# Patient Record
Sex: Male | Born: 1935 | Race: White | Hispanic: No | Marital: Married | Smoking: Never smoker
Health system: Southern US, Community
[De-identification: ages and names within clinical notes are randomized; demographics above are authoritative.]

## PROBLEM LIST (undated history)

## (undated) DIAGNOSIS — G259 Extrapyramidal and movement disorder, unspecified: Secondary | ICD-10-CM

## (undated) DIAGNOSIS — G20A1 Parkinson's disease without dyskinesia, without mention of fluctuations: Secondary | ICD-10-CM

## (undated) DIAGNOSIS — E119 Type 2 diabetes mellitus without complications: Secondary | ICD-10-CM

## (undated) DIAGNOSIS — D689 Coagulation defect, unspecified: Secondary | ICD-10-CM

## (undated) DIAGNOSIS — Z87442 Personal history of urinary calculi: Secondary | ICD-10-CM

## (undated) DIAGNOSIS — M75102 Unspecified rotator cuff tear or rupture of left shoulder, not specified as traumatic: Secondary | ICD-10-CM

## (undated) DIAGNOSIS — H539 Unspecified visual disturbance: Secondary | ICD-10-CM

## (undated) DIAGNOSIS — I10 Essential (primary) hypertension: Secondary | ICD-10-CM

## (undated) DIAGNOSIS — M1990 Unspecified osteoarthritis, unspecified site: Secondary | ICD-10-CM

## (undated) DIAGNOSIS — M12812 Other specific arthropathies, not elsewhere classified, left shoulder: Secondary | ICD-10-CM

## (undated) DIAGNOSIS — F419 Anxiety disorder, unspecified: Secondary | ICD-10-CM

## (undated) DIAGNOSIS — G20 Parkinson's disease: Secondary | ICD-10-CM

## (undated) HISTORY — PX: ROTATOR CUFF REPAIR: SHX139

## (undated) HISTORY — DX: Unspecified visual disturbance: H53.9

## (undated) HISTORY — DX: Anxiety disorder, unspecified: F41.9

## (undated) HISTORY — PX: TOTAL KNEE ARTHROPLASTY: SHX125

## (undated) HISTORY — DX: Extrapyramidal and movement disorder, unspecified: G25.9

## (undated) HISTORY — PX: CATARACT EXTRACTION W/ INTRAOCULAR LENS  IMPLANT, BILATERAL: SHX1307

## (undated) HISTORY — DX: Essential (primary) hypertension: I10

---

## 2002-12-19 ENCOUNTER — Encounter: Admission: RE | Admit: 2002-12-19 | Discharge: 2003-03-19 | Payer: Self-pay | Admitting: Family Medicine

## 2005-09-24 ENCOUNTER — Inpatient Hospital Stay (HOSPITAL_COMMUNITY): Admission: EM | Admit: 2005-09-24 | Discharge: 2005-09-30 | Payer: Self-pay | Admitting: Emergency Medicine

## 2005-09-25 ENCOUNTER — Ambulatory Visit: Payer: Self-pay | Admitting: Internal Medicine

## 2005-09-26 ENCOUNTER — Ambulatory Visit: Payer: Self-pay | Admitting: Internal Medicine

## 2006-07-27 ENCOUNTER — Ambulatory Visit: Payer: Self-pay | Admitting: Internal Medicine

## 2006-07-27 ENCOUNTER — Ambulatory Visit (HOSPITAL_COMMUNITY): Admission: RE | Admit: 2006-07-27 | Discharge: 2006-07-27 | Payer: Self-pay | Admitting: Family Medicine

## 2006-07-27 ENCOUNTER — Encounter: Payer: Self-pay | Admitting: Vascular Surgery

## 2006-08-11 LAB — CBC WITH DIFFERENTIAL/PLATELET
BASO%: 0.4 % (ref 0.0–2.0)
Basophils Absolute: 0 10*3/uL (ref 0.0–0.1)
EOS%: 0.3 % (ref 0.0–7.0)
HCT: 42 % (ref 38.7–49.9)
HGB: 14.6 g/dL (ref 13.0–17.1)
MCH: 31.6 pg (ref 28.0–33.4)
MCHC: 34.7 g/dL (ref 32.0–35.9)
MCV: 91.1 fL (ref 81.6–98.0)
MONO%: 6.1 % (ref 0.0–13.0)
NEUT%: 66.1 % (ref 40.0–75.0)
RDW: 13.1 % (ref 11.2–14.6)
lymph#: 2.3 10*3/uL (ref 0.9–3.3)

## 2006-08-14 LAB — HYPERCOAGULABLE PANEL, COMPREHENSIVE
Beta-2 Glyco I IgG: <4 U/mL (ref <20)
Beta-2-Glycoprotein I IgM: <4 U/mL (ref <10)
Homocysteine: 8.7 umol/L (ref 4.0–15.4)
Protein C Activity: 187 % — ABNORMAL HIGH (ref 91–147)
Protein C, Total: 119 % (ref 63–153)
Protein S Activity: 120 % (ref 81–180)
Protein S Ag, Total: 135 % — ABNORMAL HIGH (ref 84–134)

## 2006-08-14 LAB — PROTHROMBIN TIME: Prothrombin Time: 12.9 seconds (ref 11.6–15.2)

## 2008-08-12 ENCOUNTER — Emergency Department (HOSPITAL_BASED_OUTPATIENT_CLINIC_OR_DEPARTMENT_OTHER): Admission: EM | Admit: 2008-08-12 | Discharge: 2008-08-12 | Payer: Self-pay | Admitting: *Deleted

## 2010-07-11 ENCOUNTER — Inpatient Hospital Stay (HOSPITAL_COMMUNITY): Admission: RE | Admit: 2010-07-11 | Discharge: 2010-07-13 | Payer: Self-pay | Admitting: Orthopedic Surgery

## 2010-08-12 ENCOUNTER — Encounter
Admission: RE | Admit: 2010-08-12 | Discharge: 2010-09-16 | Payer: Self-pay | Source: Home / Self Care | Attending: Orthopedic Surgery | Admitting: Orthopedic Surgery

## 2010-09-19 ENCOUNTER — Inpatient Hospital Stay (HOSPITAL_COMMUNITY)
Admission: EM | Admit: 2010-09-19 | Discharge: 2010-09-25 | Payer: Self-pay | Source: Home / Self Care | Attending: Internal Medicine | Admitting: Internal Medicine

## 2010-09-20 ENCOUNTER — Encounter (INDEPENDENT_AMBULATORY_CARE_PROVIDER_SITE_OTHER): Payer: Self-pay | Admitting: Internal Medicine

## 2010-11-14 ENCOUNTER — Other Ambulatory Visit: Payer: Self-pay | Admitting: Orthopedic Surgery

## 2010-11-14 ENCOUNTER — Encounter (HOSPITAL_COMMUNITY): Payer: MEDICARE | Attending: Orthopedic Surgery

## 2010-11-14 DIAGNOSIS — Z01812 Encounter for preprocedural laboratory examination: Secondary | ICD-10-CM | POA: Insufficient documentation

## 2010-11-14 LAB — URINALYSIS, ROUTINE W REFLEX MICROSCOPIC
Nitrite: NEGATIVE
Protein, ur: NEGATIVE mg/dL
Specific Gravity, Urine: 1.03 (ref 1.005–1.030)
Urobilinogen, UA: 0.2 mg/dL (ref 0.0–1.0)

## 2010-11-14 LAB — COMPREHENSIVE METABOLIC PANEL
Albumin: 3 g/dL — ABNORMAL LOW (ref 3.5–5.2)
BUN: 15 mg/dL (ref 6–23)
Calcium: 10.2 mg/dL (ref 8.4–10.5)
Creatinine, Ser: 0.92 mg/dL (ref 0.4–1.5)
Glucose, Bld: 178 mg/dL — ABNORMAL HIGH (ref 70–99)
Potassium: 4.9 mEq/L (ref 3.5–5.1)
Total Protein: 7.4 g/dL (ref 6.0–8.3)

## 2010-11-14 LAB — SURGICAL PCR SCREEN
MRSA, PCR: NEGATIVE
Staphylococcus aureus: NEGATIVE

## 2010-11-14 LAB — DIFFERENTIAL
Basophils Absolute: 0 10*3/uL (ref 0.0–0.1)
Basophils Relative: 0 % (ref 0–1)
Basophils Relative: 0 % (ref 0–1)
Eosinophils Absolute: 0 10*3/uL (ref 0.0–0.7)
Eosinophils Absolute: 0 10*3/uL (ref 0.0–0.7)
Monocytes Absolute: 0.8 10*3/uL (ref 0.1–1.0)
Monocytes Relative: 7 % (ref 3–12)
Neutro Abs: 6.5 10*3/uL (ref 1.7–7.7)
Neutro Abs: 6.5 10*3/uL (ref 1.7–7.7)
Neutrophils Relative %: 58 % (ref 43–77)
Neutrophils Relative %: 58 % (ref 43–77)

## 2010-11-14 LAB — PROTIME-INR
INR: 2.93 — ABNORMAL HIGH (ref 0.00–1.49)
Prothrombin Time: 30.6 seconds — ABNORMAL HIGH (ref 11.6–15.2)

## 2010-11-14 LAB — CBC
Hemoglobin: 11.2 g/dL — ABNORMAL LOW (ref 13.0–17.0)
MCHC: 29.8 g/dL — ABNORMAL LOW (ref 30.0–36.0)
Platelets: 376 10*3/uL (ref 150–400)
RBC: 4.55 MIL/uL (ref 4.22–5.81)

## 2010-11-14 LAB — APTT: aPTT: 78 seconds — ABNORMAL HIGH (ref 24–37)

## 2010-11-15 LAB — NO BLOOD PRODUCTS

## 2010-11-21 ENCOUNTER — Inpatient Hospital Stay (HOSPITAL_COMMUNITY)
Admission: RE | Admit: 2010-11-21 | Discharge: 2010-11-24 | DRG: 470 | Disposition: A | Discharge status: Home Health | Payer: MEDICARE | Source: Ambulatory Visit | Attending: Orthopedic Surgery | Admitting: Orthopedic Surgery

## 2010-11-21 DIAGNOSIS — D62 Acute posthemorrhagic anemia: Secondary | ICD-10-CM | POA: Diagnosis not present

## 2010-11-21 DIAGNOSIS — Z96659 Presence of unspecified artificial knee joint: Secondary | ICD-10-CM

## 2010-11-21 DIAGNOSIS — Z7901 Long term (current) use of anticoagulants: Secondary | ICD-10-CM

## 2010-11-21 DIAGNOSIS — M1710 Unilateral primary osteoarthritis, unspecified knee: Principal | ICD-10-CM | POA: Diagnosis present

## 2010-11-21 DIAGNOSIS — F411 Generalized anxiety disorder: Secondary | ICD-10-CM | POA: Diagnosis present

## 2010-11-21 DIAGNOSIS — I82509 Chronic embolism and thrombosis of unspecified deep veins of unspecified lower extremity: Secondary | ICD-10-CM | POA: Diagnosis present

## 2010-11-21 DIAGNOSIS — I10 Essential (primary) hypertension: Secondary | ICD-10-CM | POA: Diagnosis present

## 2010-11-21 LAB — PROTIME-INR
INR: 1.09 (ref 0.00–1.49)
Prothrombin Time: 14.3 seconds (ref 11.6–15.2)

## 2010-11-21 LAB — GLUCOSE, CAPILLARY
Glucose-Capillary: 185 mg/dL — ABNORMAL HIGH (ref 70–99)
Glucose-Capillary: 180 mg/dL — ABNORMAL HIGH (ref 70–99)

## 2010-11-22 LAB — BASIC METABOLIC PANEL
BUN: 11 mg/dL (ref 6–23)
Chloride: 101 mEq/L (ref 96–112)
GFR calc non Af Amer: >60 mL/min (ref >60)
Glucose, Bld: 262 mg/dL — ABNORMAL HIGH (ref 70–99)
Potassium: 4.1 mEq/L (ref 3.5–5.1)
Sodium: 135 mEq/L (ref 135–145)

## 2010-11-22 LAB — PROTIME-INR: Prothrombin Time: 15 seconds (ref 11.6–15.2)

## 2010-11-22 LAB — GLUCOSE, CAPILLARY
Glucose-Capillary: 243 mg/dL — ABNORMAL HIGH (ref 70–99)
Glucose-Capillary: 165 mg/dL — ABNORMAL HIGH (ref 70–99)

## 2010-11-22 LAB — CBC
HCT: 27.3 % — ABNORMAL LOW (ref 39.0–52.0)
Hemoglobin: 8.3 g/dL — ABNORMAL LOW (ref 13.0–17.0)
MCH: 24.9 pg — ABNORMAL LOW (ref 26.0–34.0)
MCHC: 30.4 g/dL (ref 30.0–36.0)
RBC: 3.34 MIL/uL — ABNORMAL LOW (ref 4.22–5.81)

## 2010-11-22 LAB — HEMOGLOBIN A1C
Hgb A1c MFr Bld: 8.7 % — ABNORMAL HIGH (ref <5.7)
Mean Plasma Glucose: 203 mg/dL — ABNORMAL HIGH (ref <117)

## 2010-11-23 LAB — CBC
MCH: 24.5 pg — ABNORMAL LOW (ref 26.0–34.0)
MCHC: 30.1 g/dL (ref 30.0–36.0)
MCV: 81.3 fL (ref 78.0–100.0)
Platelets: 273 10*3/uL (ref 150–400)
RDW: 17.2 % — ABNORMAL HIGH (ref 11.5–15.5)

## 2010-11-23 LAB — GLUCOSE, CAPILLARY: Glucose-Capillary: 154 mg/dL — ABNORMAL HIGH (ref 70–99)

## 2010-11-23 LAB — BASIC METABOLIC PANEL
CO2: 27 mEq/L (ref 19–32)
Calcium: 9 mg/dL (ref 8.4–10.5)
Creatinine, Ser: 0.76 mg/dL (ref 0.4–1.5)
Glucose, Bld: 160 mg/dL — ABNORMAL HIGH (ref 70–99)

## 2010-11-24 LAB — CBC
HCT: 24.7 % — ABNORMAL LOW (ref 39.0–52.0)
Hemoglobin: 7.6 g/dL — ABNORMAL LOW (ref 13.0–17.0)
RDW: 17.3 % — ABNORMAL HIGH (ref 11.5–15.5)
WBC: 10.8 10*3/uL — ABNORMAL HIGH (ref 4.0–10.5)

## 2010-11-24 LAB — GLUCOSE, CAPILLARY: Glucose-Capillary: 172 mg/dL — ABNORMAL HIGH (ref 70–99)

## 2010-11-24 LAB — PROTIME-INR: INR: 1.39 (ref 0.00–1.49)

## 2010-12-06 NOTE — Op Note (Signed)
NAME:  [name] [name], [name] [name]                 ACCOUNT NO.:  [id number]  MEDICAL RECORD NO.:  [id number]           PATIENT TYPE:  I  LOCATION:  1621                         FACILITY:  [facility]  PHYSICIAN:  Lovell Nuttall L. Rendall, M.D.  DATE OF BIRTH:  09/25/1934  DATE OF PROCEDURE:  11/21/2010 DATE OF DISCHARGE:                              OPERATIVE REPORT   PREOPERATIVE DIAGNOSIS:  Osteoarthritis, left knee.  SURGICAL PROCEDURES:  Left LCS total knee arthroplasty with computer navigation assistance.  POSTOPERATIVE DIAGNOSIS:  Osteoarthritis, left knee.  SURGEON:  Chakira Jachim L. Rendall, MD  ASSISTANT:  Legrand Pitts. Duffy, PAC - present and participated in entire procedure.  ANESTHESIA:  General with femoral nerve block.  FINDINGS:  Worn out left knee with bone against bone laterally and chronic inflammatory arthritis.  Justification for inpatient setting, pain control, and range of motion.  PROCEDURE IN DETAIL:  Under general anesthesia with femoral nerve block, the left leg was prepared with DuraPrep and draped as a sterile field. Sterile tourniquet was elevated at 350 mm after wrapping out the leg with an Esmarch.  Midline incision was made.  The patella was everted, femur was sized to a standard.  Debridement was done in preparation for computer mapping.  Once debridement was completed, attention was turned to inserting Schanz pins in the superior medial tibia and distal femur. The arrays were set up, mapping was then completed.  Using the first guide, proximal tibial resection was carried out within 1 degree of anatomic alignment.  Balancer was then inserted and the collateral ligaments were balanced within 1 degree of anatomic alignment.  Flexion gap was then measured.  Attention was then turned to the femur and using the computer guide, anterior and posterior flare of the distal femur were resected for a standard prosthesis.  The distal femoral cut was then made.  Flexion and extension gaps were  balanced at approximately 12- 1/2. Debridement of the menisci and cruciates was then done and recessing guide was then used.  Proximal tibia was then exposed.  It was sized for line to line and center peg hole with keel were inserted.  The trial #4 was inserted along with the 12.5 bearing and a standard femur.  There was excellent fit and alignment, but slight back knee or hyperextension which is corrected by going to a 15-mm bearing.  The patella was then osteotomized and 3 peg holes inserted.  With all these components, the knee has within 1 degree of anatomic alignment extension to neutral and very stable to varus valgus and drawer testing.  Permanent components were then obtained.  Bony surfaces were prepared with pulse irrigation. The permanent components were then cemented in place.  Tourniquet was let down at 1 hour 5 minutes.  After the cement was hardened, multiple small vessels were cauterized.  Knee was then closed in layers with #1 Tycron, #1 Vicryl, 2-0 Vicryl, and skin clips.  Medium Hemovac drain was utilized.  The patient tolerated the procedure well and returned to Recovery in good condition.     Shardai Star L. Rendall, M.D.     [name] [name]  D:  11/21/2010  T:  11/21/2010  Job:  [id number]  Electronically Signed by Erasmo Leventhal M.D. on 12/06/2010 01:17:08 PM

## 2010-12-09 LAB — GLUCOSE, CAPILLARY
Glucose-Capillary: 131 mg/dL — ABNORMAL HIGH (ref 70–99)
Glucose-Capillary: 137 mg/dL — ABNORMAL HIGH (ref 70–99)
Glucose-Capillary: 143 mg/dL — ABNORMAL HIGH (ref 70–99)
Glucose-Capillary: 153 mg/dL — ABNORMAL HIGH (ref 70–99)
Glucose-Capillary: 189 mg/dL — ABNORMAL HIGH (ref 70–99)
Glucose-Capillary: 189 mg/dL — ABNORMAL HIGH (ref 70–99)
Glucose-Capillary: 195 mg/dL — ABNORMAL HIGH (ref 70–99)
Glucose-Capillary: 218 mg/dL — ABNORMAL HIGH (ref 70–99)
Glucose-Capillary: 220 mg/dL — ABNORMAL HIGH (ref 70–99)
Glucose-Capillary: 221 mg/dL — ABNORMAL HIGH (ref 70–99)
Glucose-Capillary: 262 mg/dL — ABNORMAL HIGH (ref 70–99)

## 2010-12-09 LAB — POCT I-STAT, CHEM 8
Calcium, Ion: 1.21 mmol/L (ref 1.12–1.32)
Glucose, Bld: 148 mg/dL — ABNORMAL HIGH (ref 70–99)
HCT: 33 % — ABNORMAL LOW (ref 39.0–52.0)
Hemoglobin: 11.2 g/dL — ABNORMAL LOW (ref 13.0–17.0)
TCO2: 28 mmol/L (ref 0–100)

## 2010-12-09 LAB — URINALYSIS, ROUTINE W REFLEX MICROSCOPIC
Glucose, UA: NEGATIVE mg/dL
Hgb urine dipstick: NEGATIVE
Specific Gravity, Urine: 1.01 (ref 1.005–1.030)

## 2010-12-09 LAB — COMPREHENSIVE METABOLIC PANEL
Albumin: 2.9 g/dL — ABNORMAL LOW (ref 3.5–5.2)
Alkaline Phosphatase: 68 U/L (ref 39–117)
BUN: 17 mg/dL (ref 6–23)
CO2: 26 mEq/L (ref 19–32)
Calcium: 9.3 mg/dL (ref 8.4–10.5)
Creatinine, Ser: 1.08 mg/dL (ref 0.4–1.5)
GFR calc Af Amer: >60 mL/min (ref >60)
GFR calc non Af Amer: >60 mL/min (ref >60)
Glucose, Bld: 202 mg/dL — ABNORMAL HIGH (ref 70–99)
Potassium: 4 mEq/L (ref 3.5–5.1)
Total Protein: 6.7 g/dL (ref 6.0–8.3)
Total Protein: 7.3 g/dL (ref 6.0–8.3)

## 2010-12-09 LAB — CBC
HCT: 29.9 % — ABNORMAL LOW (ref 39.0–52.0)
Hemoglobin: 9.2 g/dL — ABNORMAL LOW (ref 13.0–17.0)
MCH: 25 pg — ABNORMAL LOW (ref 26.0–34.0)
MCH: 25.3 pg — ABNORMAL LOW (ref 26.0–34.0)
MCH: 25.8 pg — ABNORMAL LOW (ref 26.0–34.0)
MCHC: 30.8 g/dL (ref 30.0–36.0)
MCHC: 30.8 g/dL (ref 30.0–36.0)
MCV: 81.1 fL (ref 78.0–100.0)
MCV: 81.5 fL (ref 78.0–100.0)
Platelets: 407 10*3/uL — ABNORMAL HIGH (ref 150–400)
Platelets: 427 10*3/uL — ABNORMAL HIGH (ref 150–400)
Platelets: 434 10*3/uL — ABNORMAL HIGH (ref 150–400)
RBC: 3.91 MIL/uL — ABNORMAL LOW (ref 4.22–5.81)
RDW: 14.7 % (ref 11.5–15.5)
RDW: 14.8 % (ref 11.5–15.5)
RDW: 15 % (ref 11.5–15.5)
RDW: 15.1 % (ref 11.5–15.5)
WBC: 9 10*3/uL (ref 4.0–10.5)
WBC: 9.5 10*3/uL (ref 4.0–10.5)

## 2010-12-09 LAB — LIPID PANEL
Cholesterol: 185 mg/dL (ref 0–200)
HDL: 52 mg/dL (ref >39)
LDL Cholesterol: 113 mg/dL — ABNORMAL HIGH (ref 0–99)
Triglycerides: 99 mg/dL (ref <150)

## 2010-12-09 LAB — RAPID URINE DRUG SCREEN, HOSP PERFORMED
Amphetamines: NOT DETECTED
Barbiturates: NOT DETECTED
Benzodiazepines: POSITIVE — AB
Opiates: NOT DETECTED

## 2010-12-09 LAB — PROTIME-INR
INR: 1.93 — ABNORMAL HIGH (ref 0.00–1.49)
INR: 2 — ABNORMAL HIGH (ref 0.00–1.49)
INR: 2.26 — ABNORMAL HIGH (ref 0.00–1.49)
INR: 2.57 — ABNORMAL HIGH (ref 0.00–1.49)
INR: 2.7 — ABNORMAL HIGH (ref 0.00–1.49)
Prothrombin Time: 27.7 seconds — ABNORMAL HIGH (ref 11.6–15.2)
Prothrombin Time: 28.8 seconds — ABNORMAL HIGH (ref 11.6–15.2)

## 2010-12-09 LAB — POCT CARDIAC MARKERS: Troponin i, poc: <0.05 ng/mL (ref 0.00–0.09)

## 2010-12-09 LAB — TSH: TSH: 0.628 u[IU]/mL (ref 0.350–4.500)

## 2010-12-09 LAB — IRON AND TIBC
Iron: 13 ug/dL — ABNORMAL LOW (ref 42–135)
Saturation Ratios: 6 % — ABNORMAL LOW (ref 20–55)
UIBC: 203 ug/dL

## 2010-12-09 LAB — CARDIAC PANEL(CRET KIN+CKTOT+MB+TROPI)
CK, MB: 1 ng/mL (ref 0.3–4.0)
Relative Index: INVALID (ref 0.0–2.5)
Total CK: 24 U/L (ref 7–232)
Total CK: 27 U/L (ref 7–232)
Troponin I: 0.02 ng/mL (ref 0.00–0.06)

## 2010-12-09 LAB — HEMOGLOBIN A1C: Hgb A1c MFr Bld: 7.3 % — ABNORMAL HIGH (ref <5.7)

## 2010-12-09 LAB — T3: T3, Total: 90.2 ng/dl (ref 80.0–204.0)

## 2010-12-09 LAB — DIFFERENTIAL
Basophils Absolute: 0 10*3/uL (ref 0.0–0.1)
Basophils Relative: 0 % (ref 0–1)
Eosinophils Absolute: 0 10*3/uL (ref 0.0–0.7)
Lymphs Abs: 3.3 10*3/uL (ref 0.7–4.0)
Neutrophils Relative %: 55 % (ref 43–77)

## 2010-12-09 LAB — CK TOTAL AND CKMB (NOT AT ARMC)
CK, MB: 0.9 ng/mL (ref 0.3–4.0)
Total CK: 21 U/L (ref 7–232)

## 2010-12-09 LAB — POTASSIUM: Potassium: 4 mEq/L (ref 3.5–5.1)

## 2010-12-09 LAB — MAGNESIUM: Magnesium: 1.9 mg/dL (ref 1.5–2.5)

## 2010-12-11 LAB — CBC
HCT: 27.6 % — ABNORMAL LOW (ref 39.0–52.0)
MCH: 30.2 pg (ref 26.0–34.0)
MCV: 89.1 fL (ref 78.0–100.0)
Platelets: 283 10*3/uL (ref 150–400)
RBC: 3.1 MIL/uL — ABNORMAL LOW (ref 4.22–5.81)

## 2010-12-11 LAB — BASIC METABOLIC PANEL
BUN: 14 mg/dL (ref 6–23)
CO2: 27 mEq/L (ref 19–32)
Chloride: 101 mEq/L (ref 96–112)
Creatinine, Ser: 0.95 mg/dL (ref 0.4–1.5)
GFR calc Af Amer: >60 mL/min (ref >60)

## 2010-12-11 LAB — GLUCOSE, CAPILLARY: Glucose-Capillary: 175 mg/dL — ABNORMAL HIGH (ref 70–99)

## 2010-12-12 LAB — SYNOVIAL CELL COUNT + DIFF, W/ CRYSTALS
Lymphocytes-Synovial Fld: 8 % (ref 0–20)
Neutrophil, Synovial: 89 % — ABNORMAL HIGH (ref 0–25)
WBC, Synovial: 35225 /mm3 — ABNORMAL HIGH (ref 0–200)

## 2010-12-12 LAB — PROTIME-INR
INR: 1.02 (ref 0.00–1.49)
Prothrombin Time: 13.6 seconds (ref 11.6–15.2)

## 2010-12-12 LAB — ANAEROBIC CULTURE

## 2010-12-12 LAB — COMPREHENSIVE METABOLIC PANEL
ALT: 23 U/L (ref 0–53)
AST: 20 U/L (ref 0–37)
Albumin: 3.4 g/dL — ABNORMAL LOW (ref 3.5–5.2)
Alkaline Phosphatase: 63 U/L (ref 39–117)
BUN: 26 mg/dL — ABNORMAL HIGH (ref 6–23)
Chloride: 100 mEq/L (ref 96–112)
GFR calc Af Amer: >60 mL/min (ref >60)
Potassium: 4.7 mEq/L (ref 3.5–5.1)
Sodium: 138 mEq/L (ref 135–145)
Total Bilirubin: 0.8 mg/dL (ref 0.3–1.2)
Total Protein: 7.6 g/dL (ref 6.0–8.3)

## 2010-12-12 LAB — CBC
HCT: 31.1 % — ABNORMAL LOW (ref 39.0–52.0)
MCH: 30.3 pg (ref 26.0–34.0)
MCV: 88.8 fL (ref 78.0–100.0)
MCV: 89.8 fL (ref 78.0–100.0)
Platelets: 315 10*3/uL (ref 150–400)
Platelets: 388 10*3/uL (ref 150–400)
RBC: 4.57 MIL/uL (ref 4.22–5.81)
RDW: 12.2 % (ref 11.5–15.5)
WBC: 12.8 10*3/uL — ABNORMAL HIGH (ref 4.0–10.5)

## 2010-12-12 LAB — URINALYSIS, ROUTINE W REFLEX MICROSCOPIC
Bilirubin Urine: NEGATIVE
Glucose, UA: NEGATIVE mg/dL
Ketones, ur: NEGATIVE mg/dL
Nitrite: NEGATIVE
Specific Gravity, Urine: 1.02 (ref 1.005–1.030)
pH: 5.5 (ref 5.0–8.0)

## 2010-12-12 LAB — DIFFERENTIAL
Basophils Absolute: 0 10*3/uL (ref 0.0–0.1)
Basophils Relative: 0 % (ref 0–1)
Eosinophils Absolute: 0 10*3/uL (ref 0.0–0.7)
Eosinophils Relative: 0 % (ref 0–5)
Monocytes Absolute: 0.5 10*3/uL (ref 0.1–1.0)
Monocytes Relative: 4 % (ref 3–12)

## 2010-12-12 LAB — GLUCOSE, CAPILLARY
Glucose-Capillary: 164 mg/dL — ABNORMAL HIGH (ref 70–99)
Glucose-Capillary: 171 mg/dL — ABNORMAL HIGH (ref 70–99)
Glucose-Capillary: 235 mg/dL — ABNORMAL HIGH (ref 70–99)

## 2010-12-12 LAB — GRAM STAIN

## 2010-12-12 LAB — BODY FLUID CULTURE: Culture: NO GROWTH

## 2010-12-12 LAB — BASIC METABOLIC PANEL
Calcium: 9.1 mg/dL (ref 8.4–10.5)
GFR calc Af Amer: >60 mL/min (ref >60)
GFR calc non Af Amer: >60 mL/min (ref >60)
Potassium: 4.5 mEq/L (ref 3.5–5.1)
Sodium: 136 mEq/L (ref 135–145)

## 2010-12-12 LAB — SURGICAL PCR SCREEN: MRSA, PCR: NEGATIVE

## 2010-12-25 ENCOUNTER — Ambulatory Visit: Payer: MEDICARE | Attending: Orthopedic Surgery | Admitting: Physical Therapy

## 2010-12-25 DIAGNOSIS — IMO0001 Reserved for inherently not codable concepts without codable children: Secondary | ICD-10-CM | POA: Insufficient documentation

## 2010-12-25 DIAGNOSIS — Z96659 Presence of unspecified artificial knee joint: Secondary | ICD-10-CM | POA: Insufficient documentation

## 2010-12-25 DIAGNOSIS — R262 Difficulty in walking, not elsewhere classified: Secondary | ICD-10-CM | POA: Insufficient documentation

## 2010-12-25 DIAGNOSIS — M25669 Stiffness of unspecified knee, not elsewhere classified: Secondary | ICD-10-CM | POA: Insufficient documentation

## 2010-12-25 DIAGNOSIS — M25569 Pain in unspecified knee: Secondary | ICD-10-CM | POA: Insufficient documentation

## 2010-12-30 ENCOUNTER — Ambulatory Visit: Payer: MEDICARE | Attending: Orthopedic Surgery | Admitting: Physical Therapy

## 2010-12-30 DIAGNOSIS — IMO0001 Reserved for inherently not codable concepts without codable children: Secondary | ICD-10-CM | POA: Insufficient documentation

## 2010-12-30 DIAGNOSIS — Z96659 Presence of unspecified artificial knee joint: Secondary | ICD-10-CM | POA: Insufficient documentation

## 2010-12-30 DIAGNOSIS — R262 Difficulty in walking, not elsewhere classified: Secondary | ICD-10-CM | POA: Insufficient documentation

## 2010-12-30 DIAGNOSIS — M25669 Stiffness of unspecified knee, not elsewhere classified: Secondary | ICD-10-CM | POA: Insufficient documentation

## 2010-12-30 DIAGNOSIS — M25569 Pain in unspecified knee: Secondary | ICD-10-CM | POA: Insufficient documentation

## 2010-12-31 NOTE — Discharge Summary (Signed)
NAME:  [name] [name], [name] [name]                 ACCOUNT NO.:  [id number]  MEDICAL RECORD NO.:  [id number]           PATIENT TYPE:  I  LOCATION:  1621                         FACILITY:  [facility]  PHYSICIAN:  Kortney Potvin L. Rendall, M.D.  DATE OF BIRTH:  1934-05-15  DATE OF ADMISSION:  11/21/2010 DATE OF DISCHARGE:  11/24/2010                              DISCHARGE SUMMARY   ADMISSION DIAGNOSES: 1. End-stage osteoarthritis, left knee, with a history of a right     total knee arthroplasty. 2. History of deep venous thrombosis and pulmonary embolism, on     chronic Coumadin. 3. Diabetes mellitus. 4. Anxiety. 5. Hypertension.  DISCHARGE DIAGNOSES: 1. End-stage osteoarthritis, left knee, status post left total knee     arthroplasty. 2. Acute blood loss anemia secondary to surgery. 3. Mild confusion at night possibly due to medications. 4. History of right total knee arthroplasty. 5. History of deep venous thrombosis and pulmonary embolism, on     chronic Coumadin. 6. Diabetes mellitus. 7. Anxiety. 8. Hypertension.  SURGICAL PROCEDURES:  On November 21, 2010, [name] [name] underwent a left total knee arthroplasty with computer navigation by Dr. Jonny Ruiz L. Rendall, assisted by Arnoldo Morale, PA-C.  He had a DePuy LCS complete metal back patella placed, cemented size standard with the primary femoral component cemented size standard left.  A tibial tray rotating platform NBT keel size 4 cemented with a LCS complete RP insert 15 mm thickness standard.  COMPLICATIONS:  None.  CONSULTS:  Physical Therapy and Occupational Therapy consult, November 23, 2010.  HISTORY OF PRESENT ILLNESS:  This 77 year old white male patient, presented to Dr. Priscille Kluver with history of a right total knee in October of last year and now with a 2-year history of gradual onset progressive left knee pain without injury or prior surgery.  At this point, the left knee pain is constant, stiff and hard sensation, diffuse about the knee with  some radiation into the foot.  It increases with weightbearing and moving from a sitting to a standing position and then decreases with Tylenol.  The knee pops, grinds, swells, and keeps him up at night.  He has failed conservative treatment and because of that, he is presenting for a left knee replacement.  HOSPITAL COURSE:  [name] [name] tolerated the surgical procedure well without immediate postoperative complications.  Postop day #1, T-max was 100.2, vitals were stable but hemoglobin was 8.3, hematocrit 27.3.  his Hemovac was discontinued.  He was weaned off his oxygen and vitals were monitored and he was continued on his current pain medicine.  Postop day #2, he had had a little bit of confusion at night.  He tried to get out of bed and slipped.  He had no injury at that time.  His T- max was 99.4.  Hemoglobin/hematocrit were stable.  Incision was well approximated with staples and he was doing well with therapy.  He was switched to Ultracet for pain, taken off the PCA.  He had some constipation that was treated with a laxative and he was continued on therapy.  He continued to do well.  Mental status did improve.  He seems to have some confusion at night but it was felt on February 26 study, was ready for discharge home and he was discharged home later that day.  DISCHARGE INSTRUCTIONS:  Diet:  He can resume his regular prehospitalization diet.  Medications:  Please see the home med discharge instruction sheet for complete documentation of medications.  We did place him on some new medicines including Celebrex once a day and Lovenox until his Coumadin therapeutic and Ultram for pain.  Again, please see the med discharge instruction sheet for complete documentation of the medications.  Activity:  He can be out of bed, weightbearing as tolerated on the left leg with use of a walker.  No lifting or driving for 6 weeks.  Home CPM 0 to 90 degrees 6-8 hours a day.  Please see the white  total joint discharge sheet for further activity instructions.  Wound Care:  Please see the white total joint discharge sheet for wound care instructions.  Followup:  He is to follow up with Dr. Priscille Kluver in our office on Thursday, March 8, and needs to call [phone] for that appointment.  He is arranged for his Coumadin.  PT and INR to be done at his cardiologist office as it was preoperatively.  LABORATORY DATA:  Hemoglobin/hematocrit ranged from 8.3 and 27.3 on the 24th to 7.6 and 24.7 on the 26th.  White count went from 11.7 on the 24th to 10.8 on the 26th.  PT and INR went from 14.3 and 1.09 on the 23rd to 17.3 and 1.39 on the 26th.  Sodium ranged from 135 on the 24th to 134 on the 25th.  Glucose went from 262 on the 24th to 160 on the 25th.  Hemoglobin A1c was 8.7% on the 24th.  All other laboratory studies were within normal limits.  Please let it be noted that he was not transfused with a low hemoglobin/hematocrit because he is a Jehovah's Witness and refused transfusion.     Legrand Pitts Duffy, P.A.   ______________________________ Carlisle Beers. Priscille Kluver, M.D.    KED/MEDQ  D:  12/18/2010  T:  12/18/2010  Job:  [id number]  Electronically Signed by Otilio Jefferson. on 12/25/2010 01:05:13 PM Electronically Signed by Erasmo Leventhal M.D. on 12/31/2010 01:43:09 PM

## 2011-01-01 ENCOUNTER — Ambulatory Visit: Payer: MEDICARE | Admitting: Physical Therapy

## 2011-01-06 ENCOUNTER — Ambulatory Visit: Payer: MEDICARE | Admitting: Rehabilitation

## 2011-01-07 ENCOUNTER — Other Ambulatory Visit (HOSPITAL_BASED_OUTPATIENT_CLINIC_OR_DEPARTMENT_OTHER): Payer: Self-pay | Admitting: Orthopedic Surgery

## 2011-01-07 DIAGNOSIS — M25519 Pain in unspecified shoulder: Secondary | ICD-10-CM

## 2011-01-08 ENCOUNTER — Ambulatory Visit: Payer: MEDICARE | Admitting: Rehabilitation

## 2011-01-08 ENCOUNTER — Ambulatory Visit (HOSPITAL_BASED_OUTPATIENT_CLINIC_OR_DEPARTMENT_OTHER)
Admission: RE | Admit: 2011-01-08 | Discharge: 2011-01-08 | Disposition: A | Discharge status: Home / Self Care | Payer: MEDICARE | Source: Ambulatory Visit | Attending: Orthopedic Surgery | Admitting: Orthopedic Surgery

## 2011-01-08 DIAGNOSIS — M25619 Stiffness of unspecified shoulder, not elsewhere classified: Secondary | ICD-10-CM | POA: Insufficient documentation

## 2011-01-08 DIAGNOSIS — M6281 Muscle weakness (generalized): Secondary | ICD-10-CM | POA: Insufficient documentation

## 2011-01-08 DIAGNOSIS — M67919 Unspecified disorder of synovium and tendon, unspecified shoulder: Secondary | ICD-10-CM | POA: Insufficient documentation

## 2011-01-08 DIAGNOSIS — M19019 Primary osteoarthritis, unspecified shoulder: Secondary | ICD-10-CM | POA: Insufficient documentation

## 2011-01-08 DIAGNOSIS — M25519 Pain in unspecified shoulder: Secondary | ICD-10-CM

## 2011-01-08 DIAGNOSIS — M719 Bursopathy, unspecified: Secondary | ICD-10-CM | POA: Insufficient documentation

## 2011-01-13 ENCOUNTER — Ambulatory Visit: Payer: MEDICARE | Admitting: Physical Therapy

## 2011-01-14 ENCOUNTER — Encounter: Payer: MEDICARE | Admitting: Physical Therapy

## 2011-01-17 ENCOUNTER — Ambulatory Visit: Payer: MEDICARE | Admitting: Rehabilitation

## 2011-01-20 ENCOUNTER — Ambulatory Visit: Payer: MEDICARE | Admitting: Physical Therapy

## 2011-01-22 ENCOUNTER — Ambulatory Visit: Payer: MEDICARE | Admitting: Physical Therapy

## 2011-01-28 ENCOUNTER — Ambulatory Visit: Payer: Medicare Other | Attending: Orthopedic Surgery | Admitting: Physical Therapy

## 2011-01-28 DIAGNOSIS — R262 Difficulty in walking, not elsewhere classified: Secondary | ICD-10-CM | POA: Insufficient documentation

## 2011-01-28 DIAGNOSIS — Z96659 Presence of unspecified artificial knee joint: Secondary | ICD-10-CM | POA: Insufficient documentation

## 2011-01-28 DIAGNOSIS — IMO0001 Reserved for inherently not codable concepts without codable children: Secondary | ICD-10-CM | POA: Insufficient documentation

## 2011-01-28 DIAGNOSIS — M25569 Pain in unspecified knee: Secondary | ICD-10-CM | POA: Insufficient documentation

## 2011-01-28 DIAGNOSIS — M25669 Stiffness of unspecified knee, not elsewhere classified: Secondary | ICD-10-CM | POA: Insufficient documentation

## 2011-01-30 ENCOUNTER — Ambulatory Visit: Payer: Medicare Other | Admitting: Physical Therapy

## 2011-02-03 ENCOUNTER — Ambulatory Visit: Payer: Medicare Other | Admitting: Physical Therapy

## 2011-02-05 ENCOUNTER — Ambulatory Visit: Payer: Medicare Other | Admitting: Physical Therapy

## 2011-02-06 ENCOUNTER — Ambulatory Visit: Payer: Medicare Other | Admitting: Physical Therapy

## 2011-02-10 ENCOUNTER — Ambulatory Visit: Payer: Medicare Other | Admitting: Physical Therapy

## 2011-02-12 ENCOUNTER — Ambulatory Visit: Payer: Medicare Other | Admitting: Physical Therapy

## 2011-02-13 ENCOUNTER — Ambulatory Visit: Payer: Medicare Other | Admitting: Physical Therapy

## 2011-02-14 NOTE — Consult Note (Signed)
NAME:  JAKHI, DISHMAN                 ACCOUNT NO.:  [id number]   MEDICAL RECORD NO.:  [id number]          PATIENT TYPE:  INP   LOCATION:  3001                         FACILITY:  [facility]   PHYSICIAN:  Lajuana Matte, MD  DATE OF BIRTH:  [birth year]   DATE OF CONSULTATION:  09/25/2005  DATE OF DISCHARGE:                                   CONSULTATION   REASON FOR CONSULT:  Pulmonary embolism-right lower extremity deep venous  thrombosis.   HPI:  Mr. Defina is a very pleasant 77 year old white male admitted on  September 23, 2005 with chest discomfort, was found to have bilateral  pulmonary emboli with right lower extremity DVT.  The patient denies having  any travel history recently, except [place]  He had intentional 25 pound weight loss over the last 6  months, but otherwise no significant complaints.  He lives a healthy  lifestyle.  CT angio of the chest revealed a questionable 3.1 cm mass in the  left lobe of the thyroid gland.  This was worked up with an ultrasound and  fine-needle aspiration biopsy of the mass, although the pathology is still  pending.  The patient is currently on heparin and Coumadin.  Hypercoagulable  studies, including lupus anticoagulant, is negative.  Homocysteine is still  within normal range.  PSA and CA 19-9 are normal.  We were asked to see the  patient with recommendations regarding his condition.   PAST MEDICAL HISTORY:  1.  Hypertension.  2.  History of nephrolithiasis in 2003.   SURGERIES:  Status post cosmetic rhinoplasty.   PROCEDURES:  Status post fine-needle aspiration biopsy September 25, 2005  with interventional radiology.   ALLERGIES:  NKDA.   CURRENT MEDICATIONS:  Heparin per pharmacy.  Coumadin started on September 24, 2005, per pharmacy. HCTZ 25 mg q.d., Prinivil 20 mg q.d., Tylenol  p.r.n., Phenergan p.r.n.   CURRENT REVIEW OF SYSTEMS:  Essentially negative.  He used to take aspirin  until about 3 weeks ago, and then discontinued its use.   FAMILY HISTORY:  Mother died with CVA.  Father died of old age at 2.  One  brother alive, in good health.  No family history of coagulopathy.   SOCIAL HISTORY:  The patient is married, his wife Cordelia Pen is at his bedside.  He has 1 son in good health.  He is retired Microbiologist.  No  alcohol or tobacco history.  He never smoked.  He lives and active  lifestyle, walks about 30 minutes a day.  He has never been hospitalized.   PHYSICAL EXAMINATION:  GENERAL:  On physical exam, this is a well-developed,  well-nourished 77 year old white male in no acute distress, alert and  oriented x3.  VITAL SIGNS:  Blood pressure 105/57, pulse rate 77, respirations 20,  temperature 97.9, pulse oximetry 94% in room air.  Weight 186 pounds, height  69 inches.  HEENT:  Head is normocephalic, atraumatic.  PERRLA.  Oral mucosa without  thrush or lesions.  NECK:  Supple.  No cervical or supraclavicular masses.  Cannot palpate a  thyroid nodule.  LUNGS:  Clear to auscultation bilaterally.  AXILLA:  No axillary lymph nodes.  CARDIOVASCULAR:  Regular rate and rhythm without murmurs, rubs, or gallops.  ABDOMEN:  Soft, nontender, bowel sounds x4.  No palpable spleen or liver.  GU AND RECTAL:  Deferred.  EXTREMITIES:  With no clubbing, no cyanosis, no edema.  SKIN:  Without lesions.  No bruising.  No petechiae.  NEURO:  Nonfocal.   LABS:  Hemoglobin 13.3, hematocrit 38.1, white count 9.4, platelets 208,  neutrophils 9.7, MCV 88.8.  PT 13.1, PTT 24, INR 1.0.  TSH 1.063, T4 9.4.  Sodium 136, potassium 4.6, BUN 18, creatinine 1.1, glucose 139, total  bilirubin 0.9, alkaline phosphatase 60, AST 32, ALT 33, total protein 6.8,  albumin 3.7, calcium 9.5.  CA 19-9 10.8.  Hemoccult negative.  PSA 3.59.  Homocysteine 14.6.  PTT-LA 72.7, and after mixing 4:1, is 49.9.  Lupus  anticoagulant negative.  DRVVT 49.9, after mixing 1:1, is 38.9.  Pending  labs  are factor V Leiden, PT G20210A mutation, antiphospholipid S, beta 2  glycoprotein antibody, and protein S.   RADIOLOGY STUDIES:  On December 27, a thyroid ultrasound shows multiple  bilateral fine nodules suggesting multinodular goiter, largest 3.1 cm.  On  December 26, CT angio of the chest shows large bilateral pulmonary emboli,  and a 3.1 cm bleb in the right upper lobe, with mild atelectasis of the  right base.   ASSESSMENT AND PLAN:  Dr. Arbutus Ped has seen and evaluated the patient and the  chart has been reviewed.  This is a pleasant 77 year old white male, with no  significant medical history, never been hospitalized, who presents with  chest discomfort, and was found to have bilateral pulmonary emboli and right  lower extremity deep venous thrombosis.   RECOMMENDATIONS:  1.  Continue heparin and Coumadin until PT and INR are therapeutic between      the range of 2-3.  2.  Dr. Arbutus Ped does not strongly recommend IVC filter placement at this      point unless there is a recurrent DVT formation on therapeutic      anticoagulation.  3.  Check factor XII level.  4.  Dr. Arbutus Ped will repeat a hypercoagulable panel at the [facility] in a few weeks.  5.  If no hypercoagulable abnormalities are present, continue Coumadin for      at least 1 year, but if there is evidence of hypercoagulability      abnormalities, he may require anticoagulation for life.  Dr. Arbutus Ped      will arrange a followup appointment with him at the [facility] in 2 weeks.  The primary care physician will adjust his Coumadin      dose.   Thank you very much for allowing [place] the opportunity to participate in the  care of Mr. Mckey.      Marlowe Kays, P.A.      Lajuana Matte, MD  Electronically Signed   SW/MEDQ  D:  09/26/2005  T:  09/26/2005  Job:  [id number]

## 2011-02-14 NOTE — Discharge Summary (Signed)
NAME:  [name] [name], [name] [name]                 ACCOUNT NO.:  [id number]   MEDICAL RECORD NO.:  [id number]          PATIENT TYPE:  INP   LOCATION:  3001                         FACILITY:  [facility]   PHYSICIAN:  Lonia Blood, M.D.       DATE OF BIRTH:  [birth year]   DATE OF ADMISSION:  09/23/2005  DATE OF DISCHARGE:                                 DISCHARGE SUMMARY   DISCHARGE DIAGNOSIS:  1.  Deep venous thrombosis and pulmonary embolism.  2.  Hypertension.  3.  Anxiety.  4.  History of nephrolithiasis in 2003.   DISCHARGE MEDICATIONS:  1.  Coumadin 10 mg p.o. at 6 p.m.  2.  Lovenox 80 mg subcu q.12h.  3.  Lisinopril/HCTZ 20/25 mg 1 p.o. daily.  4.  Xanax XR 1 mg p.o. daily.   CONDITION ON DISCHARGE:  The patient was discharged in good condition.   DISCHARGE INSTRUCTIONS:  At the time of discharge, she was instructed to  follow up with Dr. Si Gaul at the [facility] and to follow up  with Dr. Leonette Most from [facility] on October 02, 2005, at 8:30  a.m. for a PT/INR check.  At the time of discharge, the patient was able to  ambulate without problems.  He was without any symptoms with oxygen  saturation stable hemodynamically.   PROCEDURES:  1.  CT scan of the chest on September 24, 2005, showing large bilateral      pulmonary emboli and 3.1 cm nodule in the thyroid gland.  2.  September 24, 2005, the patient had a fine needle aspiration guided      biopsy of the thyroid nodule and the pathology of the thyroid nodule was      benign thyroid tissue.   CONSULTATIONS:  Dr. Si Gaul, hematology   HISTORY AND PHYSICAL:  For history and physical, please refer to dictated  history and physical done by Dr. Corky Downs.   HOSPITAL COURSE:  Problem 1:  Pulmonary embolus.  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  For that reason, he was started on intravenous heparin upon  admission.  Complete workup for genetic causes of hypercoagulopathy was  undertaken that were  unrevealing.  Mr. Dusza was negative for factor V liden,  negative for prothrombin G mentation, negative for protein C and protein S  modifications.  He also had a PSA level that was within normal limits and a  CA-99 that was within normal limits.  Mr. Molina was advised to have a  colonoscopy done since he never had one.  His hemoccult check was negative.  Dr. Si Gaul from hematology saw the patient in consultation and  advised the patient to follow up with him in his clinic.  The patient was  transitioned from heparin to Lovenox on September 29, 2005.  At the time of  this dictation, the patient is learning how to self-administer the Lovenox  injections at home and he will follow up with his primary care physician,  Dr. Leonette Most, on October 02, 2005, for a PT/INR check and further instructions  on  the Lovenox and Coumadin.  At the time of discharge, Mr. Brege's INR was  1.4.   Problem 2:  Anxiety.  Throughout this hospitalization, Mr. Partch had  significant problems with anxiety.  After a long discussion with the patient  and the family, it was decided he should be treated with some Xanax Extend  Release at 1 mg daily.   Problem 3:  Hypertension.  Mr. Emig was kept on Lisinopril and  hydrochlorothiazide daily and his blood pressure was well controlled.   Problem 4:  Thyroid nodule.  This was an incidental finding on the CT scan  of the lungs.  The TSH and free T4 were checked and they were both within  normal limits. A biopsy of the thyroid nodule was performed and it was  consistent with benign tissue.      Lonia Blood, M.D.  Electronically Signed     SL/MEDQ  D:  09/30/2005  T:  09/30/2005  Job:  [id number]   cc:   Dr. Virgina Norfolk, MD  Fax: [phone]

## 2011-02-14 NOTE — H&P (Signed)
NAME:  [name] [name]                 ACCOUNT NO.:  [id number]   MEDICAL RECORD NO.:  [id number]          PATIENT TYPE:  INP   LOCATION:  1823                         FACILITY:  [facility]   PHYSICIAN:  Mobolaji B. Bakare, M.D.DATE OF BIRTH:  1933/10/08   DATE OF ADMISSION:  09/23/2005  DATE OF DISCHARGE:                                HISTORY & PHYSICAL   PRIMARY CARE PHYSICIAN:  Dr. Loyal Jacobson at [facility].   CHIEF COMPLAINT:  Chest pressure and dyspnea on exertion.   HISTORY OF PRESENTING COMPLAINT:  [name] [name] is a 77 year old Caucasian male  with a history of hypertension.  Over the last one week he has been feeling  short of breath on exertion with some minimal chest pressure.  These  symptoms have been intermittent in the last one week; however, yesterday  they became persistent and he felt something was wrong, and he needed to see  his primary care physician.  He also experienced palpitations.  He has no  chest pain, nausea or diaphoresis.   The patient was seen at urgent care, [facility],  yesterday afternoon.  He was noted to have a sinus tachycardia of 110, hence  he was referred to the emergency room.  He was evaluated with an EKG, which  showed sinus tachycardia with left axis deviation.  His point of care  cardiac enzymes were slightly elevated with troponin 0.11 and normal CK/MB.  D-dimer was elevated to 15.51.  These necessitated a CT angiogram of the  chest, which confirmed a large pulmonary embolism and he was started on a  heparin infusion.   The patient has no past medical history of blood clots.  He has no family  history of blood clots.  He is not a smoker.  He has had no recent surgery  or hospitalization.  He has a pretty active lifestyle.   REVIEW OF SYSTEMS:  The review of systems is positive for dizziness.  No  melanotic stools.  No headaches, abdominal pain, diarrhea, or vomiting.  No  fever, chills or rigors.   PAST MEDICAL  HISTORY:  1.  Kidney stones three years ago.  2.  Hypertension.   PAST SURGICAL HISTORY:  Nil.   MEDICATIONS:  Lisinopril hydrochlorothiazide 20/25 mg 1 p.o. daily.   ALLERGIES:  No known drug allergies.   FAMILY HISTORY:  Father died about two months ago from old age at the age of  27.  Mother passed away secondary to a stroke.  He has one brother who is  well.   SOCIAL HISTORY:  The patient has never smoked cigarettes.  He does not drink  alcohol.  He is independent in activities of daily living.  He walks almost  30 minutes everyday. He has a pretty active lifestyle.   PHYSICAL EXAMINATION:  VITAL SIGNS:  Initial vital signs; temperature 98.1,  blood pressure 148/86, pulse 126, respiratory rate 24 and O2 sat 96% on room  air.  GENERAL APPEARANCE:  On examination the patient is not dyspneic.  HEENT:  Normocephalic and atraumatic head.  Pupils are equal, round and  react to light.  Extraocular movements are intact.  Mucous membranes moist.  No oral thrush.  NECK:  No elevated JVD.  No carotid bruits.  No palpable thyromegaly.  LUNGS:  The lungs are clear clinically to auscultation.  HEART:  Cardiovascular exam reveals S1 and S2 normal.  No murmur, gallop or  rub.  ABDOMEN:  The abdomen is obese, soft and nontender.  There is no palpable  organomegaly.  RECTAL:  Prostate is not palpably enlarged.  Rectal mucosa feels smooth.  No  mass felt in the rectum.  Stool was sent for hemoccult.  EXTREMITIES:  No pedal edema.  There is left calf swelling, which is  nontender.  Dorsalis pedis pulses are palpable, 2+, bilaterally.  No  cyanosis.  NEUROLOGIC:  Central nervous system reveals no focal neurological deficit.  Cranial nerves II-XII are intact.  SKIN:  The skin reveals no rash.  No petechiae.   LABORATORY DATA:  Initial laboratory data; white blood cells 11.5,  hemoglobin 15.6, hematocrit 44.4, MCV 88, and platelets 218,000 with  neutrophils 69%, lymphocytes 25%, and absolute  neutrophil count 8.0.  Magnesium 2.0.  Sodium 136,  potassium 4.6, chloride 102, bicarb 25, glucose  139, BUN 18, and creatinine 1.1.  Bilirubin 0.9, alkaline phosphatase 60,  AST 52, ALT 53, total protein 6.8, albumin 3.7, and calcium 9.5.  PTT 24, PT  12.6 and INR 0.9.  Thyroxine 9.4, TSH 1.063.  Cardiac markers; CK/MB 3.2,  troponin 0.6 and myoglobin 107; second set CK/MB 2.8, troponin 0.11 and  myoglobin 113; third set CK/MB 3.8, troponin 1.14 and myoglobin 133.  Stool  hemoccult negative.  D-dimer 15.51.   CT angiogram of the chest reveals large bilateral pulmonary embolism, 3.1 cm  mass left lobe of thyroid.   ASSESSMENT AND PLAN:  1.  [name] [name] is a 77 year old Caucasian male presenting with dyspnea on      exertion, sinus tachycardia and elevated D-dimer; and, computerized      tomographic scan of the chest confirmed large bilateral pulmonary      emboli.  He has no identifiable risk factors.  I am concerned about      malignancy as a precipitating factor in his age group.   We will check his prostatic specific antigen and CA-19-9.  We will check the  factor V Leiden gene and the prothrombin 2 gene mutation, antiphospholipid  antibody and homocysteine level.  We will continue heparin infusion and  Coumadin dosing as per the pharmacy.  We will check lower extremity  Dopplers.   1.  Sinus tachycardia secondary to the above.   We will give a low-dose beta blocker at 12.5 mg twice a day and this can be  discontinued once the heart rate improves.   1.  Hypertension.   We will continue his home dose of lisinopril/hydrochlorothiazide 20/25 once  a day.   1.  Left thyroid lobe mass.  The thyroid stimulating hormone and T4 at within normal limits.   We will order an ultrasound of the thyroid to further evaluate this.  The  patient may require a biopsy of this mass.      Mobolaji B. Corky Downs, M.D.  Electronically Signed    MBB/MEDQ  D:  09/24/2005  T:  09/24/2005  Job:   [id number]

## 2011-02-17 ENCOUNTER — Ambulatory Visit: Payer: Medicare Other | Admitting: Rehabilitation

## 2011-02-19 ENCOUNTER — Ambulatory Visit: Payer: Medicare Other | Admitting: Rehabilitation

## 2011-02-25 ENCOUNTER — Ambulatory Visit: Payer: Medicare Other | Admitting: Physical Therapy

## 2011-02-27 ENCOUNTER — Ambulatory Visit: Payer: Medicare Other | Admitting: Physical Therapy

## 2011-03-03 ENCOUNTER — Ambulatory Visit: Payer: Medicare Other | Attending: Orthopedic Surgery | Admitting: Physical Therapy

## 2011-03-03 DIAGNOSIS — M25569 Pain in unspecified knee: Secondary | ICD-10-CM | POA: Insufficient documentation

## 2011-03-03 DIAGNOSIS — R262 Difficulty in walking, not elsewhere classified: Secondary | ICD-10-CM | POA: Insufficient documentation

## 2011-03-03 DIAGNOSIS — M25669 Stiffness of unspecified knee, not elsewhere classified: Secondary | ICD-10-CM | POA: Insufficient documentation

## 2011-03-03 DIAGNOSIS — IMO0001 Reserved for inherently not codable concepts without codable children: Secondary | ICD-10-CM | POA: Insufficient documentation

## 2011-03-03 DIAGNOSIS — Z96659 Presence of unspecified artificial knee joint: Secondary | ICD-10-CM | POA: Insufficient documentation

## 2011-03-05 ENCOUNTER — Ambulatory Visit: Payer: Medicare Other | Admitting: Physical Therapy

## 2011-04-08 ENCOUNTER — Ambulatory Visit: Payer: Medicare Other | Attending: Orthopedic Surgery | Admitting: Rehabilitation

## 2011-04-08 DIAGNOSIS — Z96659 Presence of unspecified artificial knee joint: Secondary | ICD-10-CM | POA: Insufficient documentation

## 2011-04-08 DIAGNOSIS — IMO0001 Reserved for inherently not codable concepts without codable children: Secondary | ICD-10-CM | POA: Insufficient documentation

## 2011-04-08 DIAGNOSIS — M25569 Pain in unspecified knee: Secondary | ICD-10-CM | POA: Insufficient documentation

## 2011-04-08 DIAGNOSIS — M25669 Stiffness of unspecified knee, not elsewhere classified: Secondary | ICD-10-CM | POA: Insufficient documentation

## 2011-04-08 DIAGNOSIS — R262 Difficulty in walking, not elsewhere classified: Secondary | ICD-10-CM | POA: Insufficient documentation

## 2011-04-09 ENCOUNTER — Ambulatory Visit: Payer: Medicare Other | Admitting: Rehabilitation

## 2011-04-15 ENCOUNTER — Ambulatory Visit: Payer: Medicare Other | Admitting: Physical Therapy

## 2011-04-16 ENCOUNTER — Ambulatory Visit: Payer: Medicare Other | Admitting: Physical Therapy

## 2011-04-17 ENCOUNTER — Ambulatory Visit: Payer: Medicare Other | Admitting: Rehabilitation

## 2011-04-22 ENCOUNTER — Ambulatory Visit: Payer: Medicare Other | Admitting: Physical Therapy

## 2011-07-24 ENCOUNTER — Other Ambulatory Visit (HOSPITAL_BASED_OUTPATIENT_CLINIC_OR_DEPARTMENT_OTHER): Payer: Self-pay | Admitting: Orthopedic Surgery

## 2011-07-24 DIAGNOSIS — R52 Pain, unspecified: Secondary | ICD-10-CM

## 2011-07-26 ENCOUNTER — Ambulatory Visit (HOSPITAL_BASED_OUTPATIENT_CLINIC_OR_DEPARTMENT_OTHER)
Admission: RE | Admit: 2011-07-26 | Discharge: 2011-07-26 | Disposition: A | Discharge status: Home / Self Care | Payer: Medicare Other | Source: Ambulatory Visit | Attending: Orthopedic Surgery | Admitting: Orthopedic Surgery

## 2011-07-26 DIAGNOSIS — M249 Joint derangement, unspecified: Secondary | ICD-10-CM

## 2011-07-26 DIAGNOSIS — M25519 Pain in unspecified shoulder: Secondary | ICD-10-CM

## 2011-07-26 DIAGNOSIS — W19XXXA Unspecified fall, initial encounter: Secondary | ICD-10-CM | POA: Insufficient documentation

## 2011-07-26 DIAGNOSIS — M19019 Primary osteoarthritis, unspecified shoulder: Secondary | ICD-10-CM

## 2011-07-26 DIAGNOSIS — S43429A Sprain of unspecified rotator cuff capsule, initial encounter: Secondary | ICD-10-CM | POA: Insufficient documentation

## 2011-07-26 DIAGNOSIS — M25419 Effusion, unspecified shoulder: Secondary | ICD-10-CM

## 2011-07-26 DIAGNOSIS — R52 Pain, unspecified: Secondary | ICD-10-CM

## 2011-08-27 ENCOUNTER — Ambulatory Visit: Payer: Medicare Other | Attending: Orthopedic Surgery | Admitting: Physical Therapy

## 2011-08-27 DIAGNOSIS — IMO0001 Reserved for inherently not codable concepts without codable children: Secondary | ICD-10-CM | POA: Insufficient documentation

## 2011-08-27 DIAGNOSIS — M25619 Stiffness of unspecified shoulder, not elsewhere classified: Secondary | ICD-10-CM | POA: Insufficient documentation

## 2011-08-27 DIAGNOSIS — M25519 Pain in unspecified shoulder: Secondary | ICD-10-CM | POA: Insufficient documentation

## 2011-09-03 ENCOUNTER — Ambulatory Visit: Payer: Medicare Other | Attending: Orthopedic Surgery | Admitting: Physical Therapy

## 2011-09-03 ENCOUNTER — Encounter: Payer: Medicare Other | Admitting: Physical Therapy

## 2011-09-03 DIAGNOSIS — M25619 Stiffness of unspecified shoulder, not elsewhere classified: Secondary | ICD-10-CM | POA: Insufficient documentation

## 2011-09-03 DIAGNOSIS — M25519 Pain in unspecified shoulder: Secondary | ICD-10-CM | POA: Insufficient documentation

## 2011-09-03 DIAGNOSIS — IMO0001 Reserved for inherently not codable concepts without codable children: Secondary | ICD-10-CM | POA: Insufficient documentation

## 2011-09-15 ENCOUNTER — Ambulatory Visit: Payer: Medicare Other | Admitting: Physical Therapy

## 2011-10-02 ENCOUNTER — Ambulatory Visit: Payer: Medicare Other | Admitting: Physical Therapy

## 2011-10-06 ENCOUNTER — Ambulatory Visit: Payer: Medicare Other | Attending: Orthopedic Surgery | Admitting: Physical Therapy

## 2011-10-06 DIAGNOSIS — IMO0001 Reserved for inherently not codable concepts without codable children: Secondary | ICD-10-CM | POA: Insufficient documentation

## 2011-10-06 DIAGNOSIS — M25619 Stiffness of unspecified shoulder, not elsewhere classified: Secondary | ICD-10-CM | POA: Insufficient documentation

## 2011-10-06 DIAGNOSIS — M25519 Pain in unspecified shoulder: Secondary | ICD-10-CM | POA: Insufficient documentation

## 2011-10-08 ENCOUNTER — Ambulatory Visit: Payer: Medicare Other | Admitting: Physical Therapy

## 2011-10-15 ENCOUNTER — Ambulatory Visit: Payer: Medicare Other | Admitting: Physical Therapy

## 2011-10-22 ENCOUNTER — Ambulatory Visit: Payer: Medicare Other | Admitting: Rehabilitation

## 2011-10-29 ENCOUNTER — Ambulatory Visit: Payer: Medicare Other | Admitting: Rehabilitation

## 2011-11-04 ENCOUNTER — Ambulatory Visit: Payer: Medicare Other | Attending: Orthopedic Surgery | Admitting: Physical Therapy

## 2011-11-04 DIAGNOSIS — M25619 Stiffness of unspecified shoulder, not elsewhere classified: Secondary | ICD-10-CM | POA: Insufficient documentation

## 2011-11-04 DIAGNOSIS — M25519 Pain in unspecified shoulder: Secondary | ICD-10-CM | POA: Insufficient documentation

## 2011-11-04 DIAGNOSIS — IMO0001 Reserved for inherently not codable concepts without codable children: Secondary | ICD-10-CM | POA: Insufficient documentation

## 2011-11-11 ENCOUNTER — Ambulatory Visit: Payer: Medicare Other | Admitting: Physical Therapy

## 2014-03-26 DIAGNOSIS — E119 Type 2 diabetes mellitus without complications: Secondary | ICD-10-CM | POA: Insufficient documentation

## 2014-03-26 DIAGNOSIS — M1990 Unspecified osteoarthritis, unspecified site: Secondary | ICD-10-CM | POA: Insufficient documentation

## 2014-03-26 DIAGNOSIS — M7989 Other specified soft tissue disorders: Secondary | ICD-10-CM | POA: Insufficient documentation

## 2014-03-26 DIAGNOSIS — M25519 Pain in unspecified shoulder: Secondary | ICD-10-CM | POA: Insufficient documentation

## 2014-03-26 DIAGNOSIS — M12819 Other specific arthropathies, not elsewhere classified, unspecified shoulder: Secondary | ICD-10-CM | POA: Insufficient documentation

## 2014-03-26 DIAGNOSIS — F419 Anxiety disorder, unspecified: Secondary | ICD-10-CM | POA: Insufficient documentation

## 2014-03-26 DIAGNOSIS — H1130 Conjunctival hemorrhage, unspecified eye: Secondary | ICD-10-CM | POA: Insufficient documentation

## 2014-03-26 DIAGNOSIS — D539 Nutritional anemia, unspecified: Secondary | ICD-10-CM | POA: Insufficient documentation

## 2014-03-26 DIAGNOSIS — R7309 Other abnormal glucose: Secondary | ICD-10-CM | POA: Insufficient documentation

## 2014-03-26 DIAGNOSIS — R000 Tachycardia, unspecified: Secondary | ICD-10-CM | POA: Insufficient documentation

## 2014-03-26 DIAGNOSIS — R319 Hematuria, unspecified: Secondary | ICD-10-CM | POA: Insufficient documentation

## 2014-03-26 DIAGNOSIS — H6120 Impacted cerumen, unspecified ear: Secondary | ICD-10-CM | POA: Insufficient documentation

## 2014-03-26 DIAGNOSIS — D72829 Elevated white blood cell count, unspecified: Secondary | ICD-10-CM | POA: Insufficient documentation

## 2014-03-26 DIAGNOSIS — M25539 Pain in unspecified wrist: Secondary | ICD-10-CM | POA: Insufficient documentation

## 2014-03-26 DIAGNOSIS — L821 Other seborrheic keratosis: Secondary | ICD-10-CM | POA: Insufficient documentation

## 2014-03-26 DIAGNOSIS — D649 Anemia, unspecified: Secondary | ICD-10-CM | POA: Insufficient documentation

## 2014-03-26 DIAGNOSIS — I8390 Asymptomatic varicose veins of unspecified lower extremity: Secondary | ICD-10-CM | POA: Insufficient documentation

## 2014-03-26 DIAGNOSIS — R5383 Other fatigue: Secondary | ICD-10-CM | POA: Insufficient documentation

## 2014-03-26 DIAGNOSIS — R202 Paresthesia of skin: Secondary | ICD-10-CM | POA: Insufficient documentation

## 2014-03-26 DIAGNOSIS — H00029 Hordeolum internum unspecified eye, unspecified eyelid: Secondary | ICD-10-CM | POA: Insufficient documentation

## 2014-03-26 DIAGNOSIS — E785 Hyperlipidemia, unspecified: Secondary | ICD-10-CM | POA: Insufficient documentation

## 2014-03-26 DIAGNOSIS — I10 Essential (primary) hypertension: Secondary | ICD-10-CM | POA: Insufficient documentation

## 2014-03-26 DIAGNOSIS — M25569 Pain in unspecified knee: Secondary | ICD-10-CM | POA: Insufficient documentation

## 2014-07-11 ENCOUNTER — Other Ambulatory Visit: Payer: Self-pay | Admitting: Orthopedic Surgery

## 2014-07-11 DIAGNOSIS — M25512 Pain in left shoulder: Secondary | ICD-10-CM

## 2014-07-14 ENCOUNTER — Ambulatory Visit (HOSPITAL_BASED_OUTPATIENT_CLINIC_OR_DEPARTMENT_OTHER)
Admission: RE | Admit: 2014-07-14 | Discharge: 2014-07-14 | Disposition: A | Discharge status: Home / Self Care | Payer: Medicare Other | Source: Ambulatory Visit | Attending: Orthopedic Surgery | Admitting: Orthopedic Surgery

## 2014-07-14 ENCOUNTER — Other Ambulatory Visit: Payer: Medicare Other

## 2014-07-14 DIAGNOSIS — M25512 Pain in left shoulder: Secondary | ICD-10-CM | POA: Diagnosis present

## 2014-11-03 ENCOUNTER — Encounter (HOSPITAL_COMMUNITY): Payer: Self-pay

## 2014-11-03 ENCOUNTER — Encounter (HOSPITAL_COMMUNITY)
Admission: RE | Admit: 2014-11-03 | Discharge: 2014-11-03 | Disposition: A | Discharge status: Home / Self Care | Payer: Medicare Other | Source: Ambulatory Visit | Attending: Orthopedic Surgery | Admitting: Orthopedic Surgery

## 2014-11-03 DIAGNOSIS — Z01812 Encounter for preprocedural laboratory examination: Secondary | ICD-10-CM | POA: Diagnosis not present

## 2014-11-03 DIAGNOSIS — M19012 Primary osteoarthritis, left shoulder: Secondary | ICD-10-CM | POA: Diagnosis not present

## 2014-11-03 DIAGNOSIS — Z01810 Encounter for preprocedural cardiovascular examination: Secondary | ICD-10-CM | POA: Insufficient documentation

## 2014-11-03 HISTORY — DX: Coagulation defect, unspecified: D68.9

## 2014-11-03 HISTORY — DX: Type 2 diabetes mellitus without complications: E11.9

## 2014-11-03 HISTORY — DX: Personal history of urinary calculi: Z87.442

## 2014-11-03 HISTORY — DX: Unspecified osteoarthritis, unspecified site: M19.90

## 2014-11-03 LAB — NO BLOOD PRODUCTS

## 2014-11-03 LAB — BASIC METABOLIC PANEL
Anion gap: 7 (ref 5–15)
BUN: 23 mg/dL (ref 6–23)
CALCIUM: 9.5 mg/dL (ref 8.4–10.5)
CHLORIDE: 105 mmol/L (ref 96–112)
CO2: 27 mmol/L (ref 19–32)
Creatinine, Ser: 1.17 mg/dL (ref 0.50–1.35)
GFR, EST AFRICAN AMERICAN: 67 mL/min — AB (ref >90)
GFR, EST NON AFRICAN AMERICAN: 58 mL/min — AB (ref >90)
Glucose, Bld: 145 mg/dL — ABNORMAL HIGH (ref 70–99)
Potassium: 3.9 mmol/L (ref 3.5–5.1)
SODIUM: 139 mmol/L (ref 135–145)

## 2014-11-03 LAB — CBC
HCT: 41.2 % (ref 39.0–52.0)
HEMOGLOBIN: 13.3 g/dL (ref 13.0–17.0)
MCH: 29.6 pg (ref 26.0–34.0)
MCHC: 32.3 g/dL (ref 30.0–36.0)
MCV: 91.6 fL (ref 78.0–100.0)
PLATELETS: 230 10*3/uL (ref 150–400)
RBC: 4.5 MIL/uL (ref 4.22–5.81)
RDW: 13.6 % (ref 11.5–15.5)
WBC: 9.1 10*3/uL (ref 4.0–10.5)

## 2014-11-03 LAB — SURGICAL PCR SCREEN
MRSA, PCR: NEGATIVE
Staphylococcus aureus: POSITIVE — AB

## 2014-11-03 LAB — GLUCOSE, CAPILLARY: GLUCOSE-CAPILLARY: 207 mg/dL — AB (ref 70–99)

## 2014-11-03 NOTE — Pre-Procedure Instructions (Signed)
[name] [name]  11/03/2014   Your procedure is scheduled on:    Tuesday 11/14/14  [facility] at [facility] AM.  Call this number if you have problems the morning of surgery: [phone]   Remember:   Do not eat food or drink liquids after midnight.   Take these medicines the morning of surgery with A SIP OF WATER:   TYLENOL IF NEEDED, CITALOPRAM, METOPROLOL (LOPRESSOR)   Do not wear jewelry, make-up or nail polish.  Do not wear lotions, powders, or perfumes. You may wear deodorant.  Do not shave 48 hours prior to surgery. Men may shave face and neck.  Do not bring valuables to the hospital.  [facility] is not responsible                  for any belongings or valuables.               Contacts, dentures or bridgework may not be worn into surgery.  Leave suitcase in the car. After surgery it may be brought to your room.  For patients admitted to the hospital, discharge time is determined by your                treatment team.               Patients discharged the day of surgery will not be allowed to drive  home.  Name and phone number of your driver:  Special Instructions: [facility] - Preparing for Surgery  Before surgery, you can play an important role.  Because skin is not sterile, your skin needs to be as free of germs as possible.  You can reduce the number of germs on you skin by washing with CHG (chlorahexidine gluconate) soap before surgery.  CHG is an antiseptic cleaner which kills germs and bonds with the skin to continue killing germs even after washing.  Please DO NOT use if you have an allergy to CHG or antibacterial soaps.  If your skin becomes reddened/irritated stop using the CHG and inform your nurse when you arrive at Short Stay.  Do not shave (including legs and underarms) for at least 48 hours prior to the first CHG shower.  You may shave your face.  Please follow these instructions carefully:   1.  Shower with CHG Soap the night before  surgery and the                                morning of Surgery.  2.  If you choose to wash your hair, wash your hair first as usual with your       normal shampoo.  3.  After you shampoo, rinse your hair and body thoroughly to remove the                      Shampoo.  4.  Use CHG as you would any other liquid soap.  You can apply chg directly       to the skin and wash gently with scrungie or a clean washcloth.  5.  Apply the CHG Soap to your body ONLY FROM THE NECK DOWN.        Do not use on open wounds or open sores.  Avoid contact with your eyes,       ears, mouth and genitals (private parts).  Wash genitals (private parts)  with your normal soap.  6.  Wash thoroughly, paying special attention to the area where your surgery        will be performed.  7.  Thoroughly rinse your body with warm water from the neck down.  8.  DO NOT shower/wash with your normal soap after using and rinsing off       the CHG Soap.  9.  Pat yourself dry with a clean towel.            10.  Wear clean pajamas.            11.  Place clean sheets on your bed the night of your first shower and do not        sleep with pets.  Day of Surgery  Do not apply any lotions/deoderants the morning of surgery.  Please wear clean clothes to the hospital/surgery center.     Please read over the following fact sheets that you were given: Pain Booklet, Coughing and Deep Breathing, MRSA Information and Surgical Site Infection Prevention

## 2014-11-03 NOTE — Progress Notes (Signed)
FAXED BLOOD REFUSAL TO BLOOD BANK.

## 2014-11-03 NOTE — Progress Notes (Signed)
Mupirocin Ointment Rx called into [facility] on [address] for positive PCR of Staph. Pt's wife notified (pt was asleep) and she voiced understanding

## 2014-11-03 NOTE — Progress Notes (Signed)
   11/03/14 1438  OBSTRUCTIVE SLEEP APNEA  Have you ever been diagnosed with sleep apnea through a sleep study? No  Do you snore loudly (loud enough to be heard through closed doors)?  0  Do you often feel tired, fatigued, or sleepy during the daytime? 0  Has anyone observed you stop breathing during your sleep? 0  Do you have, or are you being treated for high blood pressure? 1  BMI more than 35 kg/m2? 0  Age over 79 years old? 1  Neck circumference greater than 40 cm/16 inches? 1  Gender: 1  Obstructive Sleep Apnea Score 4  Score 4 or greater  Results sent to PCP

## 2014-11-04 LAB — HEMOGLOBIN A1C
HEMOGLOBIN A1C: 6.5 % — AB (ref 4.8–5.6)
MEAN PLASMA GLUCOSE: 140 mg/dL

## 2014-11-13 MED ORDER — CEFAZOLIN SODIUM-DEXTROSE 2-3 GM-% IV SOLR
2.0000 g | INTRAVENOUS | Status: AC
  Administered 2014-11-14: 2 g via INTRAVENOUS
  Filled 2014-11-13: qty 50

## 2014-11-14 ENCOUNTER — Inpatient Hospital Stay (HOSPITAL_COMMUNITY)
Admission: RE | Admit: 2014-11-14 | Discharge: 2014-11-17 | DRG: 483 | Disposition: A | Discharge status: Skilled Nursing Facility | Payer: Medicare Other | Source: Ambulatory Visit | Attending: Orthopedic Surgery | Admitting: Orthopedic Surgery

## 2014-11-14 ENCOUNTER — Encounter (HOSPITAL_COMMUNITY)
Admission: RE | Disposition: A | Discharge status: Skilled Nursing Facility | Payer: Self-pay | Source: Ambulatory Visit | Attending: Orthopedic Surgery

## 2014-11-14 ENCOUNTER — Inpatient Hospital Stay (HOSPITAL_COMMUNITY): Payer: Medicare Other | Admitting: Certified Registered Nurse Anesthetist

## 2014-11-14 ENCOUNTER — Encounter (HOSPITAL_COMMUNITY): Payer: Self-pay | Admitting: *Deleted

## 2014-11-14 ENCOUNTER — Inpatient Hospital Stay (HOSPITAL_COMMUNITY): Payer: Medicare Other

## 2014-11-14 DIAGNOSIS — Z96653 Presence of artificial knee joint, bilateral: Secondary | ICD-10-CM | POA: Diagnosis present

## 2014-11-14 DIAGNOSIS — E119 Type 2 diabetes mellitus without complications: Secondary | ICD-10-CM

## 2014-11-14 DIAGNOSIS — Z888 Allergy status to other drugs, medicaments and biological substances status: Secondary | ICD-10-CM | POA: Diagnosis not present

## 2014-11-14 DIAGNOSIS — D7589 Other specified diseases of blood and blood-forming organs: Secondary | ICD-10-CM

## 2014-11-14 DIAGNOSIS — Z79899 Other long term (current) drug therapy: Secondary | ICD-10-CM

## 2014-11-14 DIAGNOSIS — N183 Chronic kidney disease, stage 3 unspecified: Secondary | ICD-10-CM | POA: Diagnosis present

## 2014-11-14 DIAGNOSIS — M12512 Traumatic arthropathy, left shoulder: Secondary | ICD-10-CM

## 2014-11-14 DIAGNOSIS — I129 Hypertensive chronic kidney disease with stage 1 through stage 4 chronic kidney disease, or unspecified chronic kidney disease: Secondary | ICD-10-CM | POA: Diagnosis present

## 2014-11-14 DIAGNOSIS — M12812 Other specific arthropathies, not elsewhere classified, left shoulder: Secondary | ICD-10-CM | POA: Diagnosis present

## 2014-11-14 DIAGNOSIS — Z86718 Personal history of other venous thrombosis and embolism: Secondary | ICD-10-CM | POA: Diagnosis not present

## 2014-11-14 DIAGNOSIS — F419 Anxiety disorder, unspecified: Secondary | ICD-10-CM | POA: Diagnosis not present

## 2014-11-14 DIAGNOSIS — D689 Coagulation defect, unspecified: Secondary | ICD-10-CM | POA: Diagnosis present

## 2014-11-14 DIAGNOSIS — M1990 Unspecified osteoarthritis, unspecified site: Secondary | ICD-10-CM | POA: Diagnosis present

## 2014-11-14 DIAGNOSIS — R2689 Other abnormalities of gait and mobility: Secondary | ICD-10-CM

## 2014-11-14 DIAGNOSIS — M75102 Unspecified rotator cuff tear or rupture of left shoulder, not specified as traumatic: Principal | ICD-10-CM | POA: Diagnosis present

## 2014-11-14 DIAGNOSIS — Z7901 Long term (current) use of anticoagulants: Secondary | ICD-10-CM | POA: Diagnosis not present

## 2014-11-14 DIAGNOSIS — I5032 Chronic diastolic (congestive) heart failure: Secondary | ICD-10-CM | POA: Diagnosis present

## 2014-11-14 DIAGNOSIS — I10 Essential (primary) hypertension: Secondary | ICD-10-CM | POA: Diagnosis present

## 2014-11-14 DIAGNOSIS — Z96612 Presence of left artificial shoulder joint: Secondary | ICD-10-CM

## 2014-11-14 DIAGNOSIS — Z87442 Personal history of urinary calculi: Secondary | ICD-10-CM | POA: Diagnosis not present

## 2014-11-14 HISTORY — PX: REVERSE SHOULDER ARTHROPLASTY: SHX5054

## 2014-11-14 HISTORY — DX: Other specific arthropathies, not elsewhere classified, left shoulder: M12.812

## 2014-11-14 HISTORY — DX: Unspecified rotator cuff tear or rupture of left shoulder, not specified as traumatic: M75.102

## 2014-11-14 LAB — GLUCOSE, CAPILLARY
GLUCOSE-CAPILLARY: 157 mg/dL — AB (ref 70–99)
GLUCOSE-CAPILLARY: 174 mg/dL — AB (ref 70–99)
Glucose-Capillary: 101 mg/dL — ABNORMAL HIGH (ref 70–99)
Glucose-Capillary: 234 mg/dL — ABNORMAL HIGH (ref 70–99)

## 2014-11-14 LAB — BRAIN NATRIURETIC PEPTIDE: B Natriuretic Peptide: 152.2 pg/mL — ABNORMAL HIGH (ref 0.0–100.0)

## 2014-11-14 SURGERY — ARTHROPLASTY, SHOULDER, TOTAL, REVERSE
Anesthesia: Regional | Site: Shoulder | Laterality: Left

## 2014-11-14 MED ORDER — SENNA-DOCUSATE SODIUM 8.6-50 MG PO TABS
2.0000 | ORAL_TABLET | Freq: Every day | ORAL | Status: DC
Start: 2014-11-14 — End: 2015-07-11

## 2014-11-14 MED ORDER — ONDANSETRON HCL 4 MG PO TABS: 4.0000 mg | ORAL_TABLET | Freq: Three times a day (TID) | ORAL | Status: DC | PRN

## 2014-11-14 MED ORDER — POLYETHYLENE GLYCOL 3350 17 G PO PACK: 17.0000 g | PACK | Freq: Every day | ORAL | Status: DC | PRN

## 2014-11-14 MED ORDER — BUPIVACAINE-EPINEPHRINE (PF) 0.5% -1:200000 IJ SOLN
INTRAMUSCULAR | Status: DC | PRN
  Administered 2014-11-14: 30 mL via PERINEURAL

## 2014-11-14 MED ORDER — DEXTROSE 5 % IV SOLN
10.0000 mg | INTRAVENOUS | Status: DC | PRN
  Administered 2014-11-14: 10 ug/min via INTRAVENOUS

## 2014-11-14 MED ORDER — MIDAZOLAM HCL 2 MG/2ML IJ SOLN
INTRAMUSCULAR | Status: AC
  Filled 2014-11-14: qty 2

## 2014-11-14 MED ORDER — METOCLOPRAMIDE HCL 5 MG/ML IJ SOLN: 5.0000 mg | Freq: Three times a day (TID) | INTRAMUSCULAR | Status: DC | PRN

## 2014-11-14 MED ORDER — MIDAZOLAM HCL 5 MG/5ML IJ SOLN
INTRAMUSCULAR | Status: DC | PRN
  Administered 2014-11-14: 0.5 mg via INTRAVENOUS

## 2014-11-14 MED ORDER — EPHEDRINE SULFATE 50 MG/ML IJ SOLN
INTRAMUSCULAR | Status: DC | PRN
  Administered 2014-11-14: 5 mg via INTRAVENOUS

## 2014-11-14 MED ORDER — HYDROMORPHONE HCL 1 MG/ML IJ SOLN
0.5000 mg | INTRAMUSCULAR | Status: DC | PRN
  Administered 2014-11-15: 1 mg via INTRAVENOUS
  Filled 2014-11-14: qty 1

## 2014-11-14 MED ORDER — LACTATED RINGERS IV SOLN
INTRAVENOUS | Status: DC | PRN
  Administered 2014-11-14 (×2): via INTRAVENOUS

## 2014-11-14 MED ORDER — CEFAZOLIN SODIUM 1-5 GM-% IV SOLN
1.0000 g | Freq: Four times a day (QID) | INTRAVENOUS | Status: AC
  Administered 2014-11-14 – 2014-11-15 (×3): 1 g via INTRAVENOUS
  Filled 2014-11-14 (×3): qty 50

## 2014-11-14 MED ORDER — ROCURONIUM BROMIDE 100 MG/10ML IV SOLN
INTRAVENOUS | Status: DC | PRN
  Administered 2014-11-14: 50 mg via INTRAVENOUS

## 2014-11-14 MED ORDER — SODIUM CHLORIDE 0.9 % IV SOLN
INTRAVENOUS | Status: DC
  Administered 2014-11-14: 23:00:00 via INTRAVENOUS

## 2014-11-14 MED ORDER — PROMETHAZINE HCL 25 MG/ML IJ SOLN: 6.2500 mg | INTRAMUSCULAR | Status: DC | PRN

## 2014-11-14 MED ORDER — FENTANYL CITRATE 0.05 MG/ML IJ SOLN: 25.0000 ug | INTRAMUSCULAR | Status: DC | PRN

## 2014-11-14 MED ORDER — RIVAROXABAN 20 MG PO TABS
20.0000 mg | ORAL_TABLET | Freq: Every day | ORAL | Status: DC
  Administered 2014-11-14 – 2014-11-16 (×3): 20 mg via ORAL
  Filled 2014-11-14 (×3): qty 1

## 2014-11-14 MED ORDER — METHOCARBAMOL 1000 MG/10ML IJ SOLN
500.0000 mg | INTRAVENOUS | Status: DC
  Filled 2014-11-14: qty 5

## 2014-11-14 MED ORDER — 0.9 % SODIUM CHLORIDE (POUR BTL) OPTIME
TOPICAL | Status: DC | PRN
  Administered 2014-11-14: 1000 mL

## 2014-11-14 MED ORDER — MENTHOL 3 MG MT LOZG: 1.0000 | LOZENGE | OROMUCOSAL | Status: DC | PRN

## 2014-11-14 MED ORDER — METHOCARBAMOL 500 MG PO TABS
500.0000 mg | ORAL_TABLET | Freq: Four times a day (QID) | ORAL | Status: DC | PRN
  Administered 2014-11-14 – 2014-11-15 (×5): 500 mg via ORAL
  Filled 2014-11-14 (×4): qty 1

## 2014-11-14 MED ORDER — PROPOFOL 10 MG/ML IV BOLUS
INTRAVENOUS | Status: AC
  Filled 2014-11-14: qty 20

## 2014-11-14 MED ORDER — ALUM & MAG HYDROXIDE-SIMETH 200-200-20 MG/5ML PO SUSP: 30.0000 mL | ORAL | Status: DC | PRN

## 2014-11-14 MED ORDER — BACLOFEN 10 MG PO TABS: 10.0000 mg | ORAL_TABLET | Freq: Three times a day (TID) | ORAL | Status: DC

## 2014-11-14 MED ORDER — METOCLOPRAMIDE HCL 10 MG PO TABS: 5.0000 mg | ORAL_TABLET | Freq: Three times a day (TID) | ORAL | Status: DC | PRN

## 2014-11-14 MED ORDER — DOCUSATE SODIUM 100 MG PO CAPS
100.0000 mg | ORAL_CAPSULE | Freq: Two times a day (BID) | ORAL | Status: DC
  Administered 2014-11-14 – 2014-11-17 (×6): 100 mg via ORAL
  Filled 2014-11-14 (×8): qty 1

## 2014-11-14 MED ORDER — HYDROCODONE-ACETAMINOPHEN 10-325 MG PO TABS: 1.0000 | ORAL_TABLET | Freq: Four times a day (QID) | ORAL | Status: DC | PRN

## 2014-11-14 MED ORDER — LISINOPRIL 5 MG PO TABS
5.0000 mg | ORAL_TABLET | Freq: Every day | ORAL | Status: DC
  Administered 2014-11-14 – 2014-11-16 (×3): 5 mg via ORAL
  Filled 2014-11-14 (×3): qty 1

## 2014-11-14 MED ORDER — SENNA 8.6 MG PO TABS
1.0000 | ORAL_TABLET | Freq: Two times a day (BID) | ORAL | Status: DC
  Administered 2014-11-14 – 2014-11-17 (×6): 8.6 mg via ORAL
  Filled 2014-11-14 (×7): qty 1

## 2014-11-14 MED ORDER — ACETAMINOPHEN 650 MG RE SUPP: 650.0000 mg | Freq: Four times a day (QID) | RECTAL | Status: DC | PRN

## 2014-11-14 MED ORDER — HYDROCODONE-ACETAMINOPHEN 5-325 MG PO TABS
ORAL_TABLET | ORAL | Status: AC
  Administered 2014-11-14: 2
  Filled 2014-11-14: qty 2

## 2014-11-14 MED ORDER — BISACODYL 10 MG RE SUPP
10.0000 mg | Freq: Every day | RECTAL | Status: DC | PRN
Start: 2014-11-14 — End: 2014-11-17

## 2014-11-14 MED ORDER — CITALOPRAM HYDROBROMIDE 20 MG PO TABS
20.0000 mg | ORAL_TABLET | Freq: Every day | ORAL | Status: DC
  Administered 2014-11-14 – 2014-11-17 (×4): 20 mg via ORAL
  Filled 2014-11-14 (×4): qty 1

## 2014-11-14 MED ORDER — INSULIN ASPART 100 UNIT/ML ~~LOC~~ SOLN
0.0000 [IU] | Freq: Three times a day (TID) | SUBCUTANEOUS | Status: DC
  Administered 2014-11-14: 5 [IU] via SUBCUTANEOUS
  Administered 2014-11-15 (×2): 3 [IU] via SUBCUTANEOUS
  Administered 2014-11-15: 5 [IU] via SUBCUTANEOUS
  Administered 2014-11-16: 3 [IU] via SUBCUTANEOUS
  Administered 2014-11-16: 5 [IU] via SUBCUTANEOUS
  Administered 2014-11-16: 3 [IU] via SUBCUTANEOUS
  Administered 2014-11-17: 2 [IU] via SUBCUTANEOUS

## 2014-11-14 MED ORDER — METHOCARBAMOL 1000 MG/10ML IJ SOLN
500.0000 mg | Freq: Four times a day (QID) | INTRAVENOUS | Status: DC | PRN
  Filled 2014-11-14: qty 5

## 2014-11-14 MED ORDER — POTASSIUM CHLORIDE IN NACL 20-0.45 MEQ/L-% IV SOLN
INTRAVENOUS | Status: DC
  Administered 2014-11-14: 14:00:00 via INTRAVENOUS
  Filled 2014-11-14 (×2): qty 1000

## 2014-11-14 MED ORDER — NEOSTIGMINE METHYLSULFATE 10 MG/10ML IV SOLN
INTRAVENOUS | Status: DC | PRN
  Administered 2014-11-14: 3 mg via INTRAVENOUS

## 2014-11-14 MED ORDER — FENTANYL CITRATE 0.05 MG/ML IJ SOLN
INTRAMUSCULAR | Status: AC
  Filled 2014-11-14: qty 5

## 2014-11-14 MED ORDER — PROPOFOL 10 MG/ML IV BOLUS
INTRAVENOUS | Status: DC | PRN
  Administered 2014-11-14: 50 mg via INTRAVENOUS

## 2014-11-14 MED ORDER — ONDANSETRON HCL 4 MG PO TABS: 4.0000 mg | ORAL_TABLET | Freq: Four times a day (QID) | ORAL | Status: DC | PRN

## 2014-11-14 MED ORDER — MIDAZOLAM HCL 2 MG/2ML IJ SOLN: 0.5000 mg | Freq: Once | INTRAMUSCULAR | Status: DC | PRN

## 2014-11-14 MED ORDER — DEXAMETHASONE SODIUM PHOSPHATE 4 MG/ML IJ SOLN
INTRAMUSCULAR | Status: DC | PRN
  Administered 2014-11-14: 4 mg via INTRAVENOUS

## 2014-11-14 MED ORDER — FLEET ENEMA 7-19 GM/118ML RE ENEM: 1.0000 | ENEMA | Freq: Once | RECTAL | Status: AC | PRN

## 2014-11-14 MED ORDER — GLIPIZIDE 5 MG PO TABS
5.0000 mg | ORAL_TABLET | Freq: Every day | ORAL | Status: DC
  Administered 2014-11-15 – 2014-11-17 (×3): 5 mg via ORAL
  Filled 2014-11-14 (×3): qty 1

## 2014-11-14 MED ORDER — FENTANYL CITRATE 0.05 MG/ML IJ SOLN
INTRAMUSCULAR | Status: DC | PRN
  Administered 2014-11-14: 50 ug via INTRAVENOUS
  Administered 2014-11-14: 200 ug via INTRAVENOUS

## 2014-11-14 MED ORDER — MEPERIDINE HCL 25 MG/ML IJ SOLN: 6.2500 mg | INTRAMUSCULAR | Status: DC | PRN

## 2014-11-14 MED ORDER — ONDANSETRON HCL 4 MG/2ML IJ SOLN
INTRAMUSCULAR | Status: DC | PRN
  Administered 2014-11-14: 4 mg via INTRAVENOUS

## 2014-11-14 MED ORDER — METHOCARBAMOL 500 MG PO TABS
ORAL_TABLET | ORAL | Status: AC
  Administered 2014-11-14: 500 mg via ORAL
  Filled 2014-11-14: qty 1

## 2014-11-14 MED ORDER — ACETAMINOPHEN 325 MG PO TABS: 650.0000 mg | ORAL_TABLET | Freq: Four times a day (QID) | ORAL | Status: DC | PRN

## 2014-11-14 MED ORDER — HYDROCODONE-ACETAMINOPHEN 10-325 MG PO TABS
1.0000 | ORAL_TABLET | ORAL | Status: DC | PRN
  Administered 2014-11-15: 2 via ORAL
  Filled 2014-11-14: qty 2

## 2014-11-14 MED ORDER — PHENOL 1.4 % MT LIQD: 1.0000 | OROMUCOSAL | Status: DC | PRN

## 2014-11-14 MED ORDER — METOPROLOL TARTRATE 50 MG PO TABS
50.0000 mg | ORAL_TABLET | Freq: Two times a day (BID) | ORAL | Status: DC
  Administered 2014-11-14 – 2014-11-17 (×7): 50 mg via ORAL
  Filled 2014-11-14 (×7): qty 1

## 2014-11-14 MED ORDER — ONDANSETRON HCL 4 MG/2ML IJ SOLN: 4.0000 mg | Freq: Four times a day (QID) | INTRAMUSCULAR | Status: DC | PRN

## 2014-11-14 MED ORDER — GLYCOPYRROLATE 0.2 MG/ML IJ SOLN
INTRAMUSCULAR | Status: DC | PRN
  Administered 2014-11-14: 0.4 mg via INTRAVENOUS

## 2014-11-14 SURGICAL SUPPLY — 82 items
APL SKNCLS STERI-STRIP NONHPOA (GAUZE/BANDAGES/DRESSINGS)
BENZOIN TINCTURE PRP APPL 2/3 (GAUZE/BANDAGES/DRESSINGS) ×1 IMPLANT
BIT DRILL 5/64X5 DISP (BIT) ×4 IMPLANT
BIT DRILL TWIST 2.7 (BIT) IMPLANT
BIT DRILL TWIST 2.7MM (BIT)
BLADE SAW SGTL MED 73X18.5 STR (BLADE) ×4 IMPLANT
BOWL SMART MIX CTS (DISPOSABLE) IMPLANT
BRUSH FEMORAL CANAL (MISCELLANEOUS) IMPLANT
CAPT SHLDR REVTOTAL 2 ×3 IMPLANT
CLOSURE STERI-STRIP 1/2X4 (GAUZE/BANDAGES/DRESSINGS) ×1
CLSR STERI-STRIP ANTIMIC 1/2X4 (GAUZE/BANDAGES/DRESSINGS) ×3 IMPLANT
COVER SURGICAL LIGHT HANDLE (MISCELLANEOUS) ×4 IMPLANT
COVER TABLE BACK 60X90 (DRAPES) IMPLANT
DRAPE C-ARM 42X72 X-RAY (DRAPES) IMPLANT
DRAPE IMP U-DRAPE 54X76 (DRAPES) ×4 IMPLANT
DRAPE U-SHAPE 47X51 STRL (DRAPES) ×4 IMPLANT
DRSG MEPILEX BORDER 4X8 (GAUZE/BANDAGES/DRESSINGS) ×3 IMPLANT
DURAPREP 26ML APPLICATOR (WOUND CARE) ×4 IMPLANT
ELECT CAUTERY BLADE 6.4 (BLADE) ×3 IMPLANT
ELECT REM PT RETURN 9FT ADLT (ELECTROSURGICAL) ×4
ELECTRODE REM PT RTRN 9FT ADLT (ELECTROSURGICAL) ×2 IMPLANT
EVACUATOR 1/8 PVC DRAIN (DRAIN) IMPLANT
FACESHIELD WRAPAROUND (MASK) IMPLANT
FACESHIELD WRAPAROUND OR TEAM (MASK) IMPLANT
GAUZE SPONGE 4X4 12PLY STRL (GAUZE/BANDAGES/DRESSINGS) ×1 IMPLANT
GLOVE BIO SURGEON STRL SZ 6.5 (GLOVE) ×2 IMPLANT
GLOVE BIO SURGEON STRL SZ7 (GLOVE) ×9 IMPLANT
GLOVE BIO SURGEONS STRL SZ 6.5 (GLOVE) ×1
GLOVE BIOGEL PI IND STRL 6.5 (GLOVE) ×2 IMPLANT
GLOVE BIOGEL PI IND STRL 7.0 (GLOVE) ×1 IMPLANT
GLOVE BIOGEL PI IND STRL 8 (GLOVE) ×2 IMPLANT
GLOVE BIOGEL PI INDICATOR 6.5 (GLOVE) ×4
GLOVE BIOGEL PI INDICATOR 7.0 (GLOVE) ×2
GLOVE BIOGEL PI INDICATOR 8 (GLOVE) ×2
GLOVE BIOGEL PI ORTHO PRO SZ8 (GLOVE) ×2
GLOVE ECLIPSE 6.5 STRL STRAW (GLOVE) ×6 IMPLANT
GLOVE ORTHO TXT STRL SZ7.5 (GLOVE) ×7 IMPLANT
GLOVE PI ORTHO PRO STRL SZ8 (GLOVE) ×2 IMPLANT
GLOVE SURG ORTHO 8.0 STRL STRW (GLOVE) ×5 IMPLANT
GLOVE SURG SS PI 7.0 STRL IVOR (GLOVE) ×3 IMPLANT
GOWN STRL REUS W/ TWL LRG LVL3 (GOWN DISPOSABLE) ×4 IMPLANT
GOWN STRL REUS W/ TWL XL LVL3 (GOWN DISPOSABLE) ×2 IMPLANT
GOWN STRL REUS W/TWL 2XL LVL3 (GOWN DISPOSABLE) ×4 IMPLANT
GOWN STRL REUS W/TWL LRG LVL3 (GOWN DISPOSABLE) ×12
GOWN STRL REUS W/TWL XL LVL3 (GOWN DISPOSABLE) ×4
HANDPIECE INTERPULSE COAX TIP (DISPOSABLE)
HOOD PEEL AWAY FACE SHEILD DIS (HOOD) ×11 IMPLANT
KIT BASIN OR (CUSTOM PROCEDURE TRAY) ×4 IMPLANT
KIT ROOM TURNOVER OR (KITS) ×4 IMPLANT
MANIFOLD NEPTUNE II (INSTRUMENTS) ×4 IMPLANT
NDL 1/2 CIR CATGUT .05X1.09 (NEEDLE) IMPLANT
NDL HYPO 25GX1X1/2 BEV (NEEDLE) IMPLANT
NEEDLE 1/2 CIR CATGUT .05X1.09 (NEEDLE) IMPLANT
NEEDLE HYPO 25GX1X1/2 BEV (NEEDLE) IMPLANT
NS IRRIG 1000ML POUR BTL (IV SOLUTION) ×4 IMPLANT
PACK SHOULDER (CUSTOM PROCEDURE TRAY) ×4 IMPLANT
PACK UNIVERSAL I (CUSTOM PROCEDURE TRAY) ×4 IMPLANT
PAD ARMBOARD 7.5X6 YLW CONV (MISCELLANEOUS) ×8 IMPLANT
PENCIL BUTTON HOLSTER BLD 10FT (ELECTRODE) ×3 IMPLANT
PIN HUMERAL STMN 3.2MMX9IN (INSTRUMENTS) IMPLANT
PIN STEINMANN THREADED TIP (PIN) IMPLANT
SET HNDPC FAN SPRY TIP SCT (DISPOSABLE) IMPLANT
SLING ARM IMMOBILIZER LRG (SOFTGOODS) ×3 IMPLANT
SLING ARM IMMOBILIZER MED (SOFTGOODS) IMPLANT
SMARTMIX MINI TOWER (MISCELLANEOUS)
SPONGE LAP 18X18 X RAY DECT (DISPOSABLE) ×1 IMPLANT
SUCTION FRAZIER TIP 10 FR DISP (SUCTIONS) ×4 IMPLANT
SUPPORT WRAP ARM LG (MISCELLANEOUS) ×4 IMPLANT
SUT FIBERWIRE #2 38 REV NDL BL (SUTURE) ×8
SUT MAXBRAID (SUTURE) ×14 IMPLANT
SUT MNCRL AB 4-0 PS2 18 (SUTURE) IMPLANT
SUT VIC AB 0 CT1 27 (SUTURE) ×4
SUT VIC AB 0 CT1 27XBRD ANBCTR (SUTURE) ×2 IMPLANT
SUT VIC AB 2-0 CT1 27 (SUTURE) ×4
SUT VIC AB 2-0 CT1 TAPERPNT 27 (SUTURE) ×1 IMPLANT
SUT VIC AB 3-0 SH 8-18 (SUTURE) ×4 IMPLANT
SUTURE FIBERWR#2 38 REV NDL BL (SUTURE) ×2 IMPLANT
SYR CONTROL 10ML LL (SYRINGE) IMPLANT
TOWEL OR 17X24 6PK STRL BLUE (TOWEL DISPOSABLE) ×4 IMPLANT
TOWEL OR 17X26 10 PK STRL BLUE (TOWEL DISPOSABLE) ×4 IMPLANT
TOWER SMARTMIX MINI (MISCELLANEOUS) IMPLANT
WATER STERILE IRR 1000ML POUR (IV SOLUTION) ×1 IMPLANT

## 2014-11-14 NOTE — Progress Notes (Signed)
Orthopedic Tech Progress Note Patient Details:  [name] [name] 01/09/1934 [id number]  Patient ID: [name] [name], male   DOB: 06/14/1934, 81 y.o.   MRN: [id number] Pt unable to use trapeze bar patient helper  [name] [name], [name] [name] 11/14/2014, 1:53 PM

## 2014-11-14 NOTE — Discharge Instructions (Signed)
Diet: As you were doing prior to hospitalization  ° °Shower:  May shower but keep the wounds dry, use an occlusive plastic wrap, NO SOAKING IN TUB.  If the bandage gets wet, change with a clean dry gauze. ° °Dressing:  You may change your dressing 3-5 days after surgery.  Then change the dressing daily with sterile gauze dressing.   ° °There are sticky tapes (steri-strips) on your wounds and all the stitches are absorbable.  Leave the steri-strips in place when changing your dressings, they will peel off with time, usually 2-3 weeks. ° °Activity:  Increase activity slowly as tolerated, but follow the weight bearing instructions below.  No lifting or driving for 6 weeks. ° °Weight Bearing:   Sling at all times..   ° °To prevent constipation: you may use a stool softener such as - ° °Colace (over the counter) 100 mg by mouth twice a day  °Drink plenty of fluids (prune juice may be helpful) and high fiber foods °Miralax (over the counter) for constipation as needed.   ° °Itching:  If you experience itching with your medications, try taking only a single pain pill, or even half a pain pill at a time.  You may take up to 10 pain pills per day, and you can also use benadryl over the counter for itching or also to help with sleep.  ° °Precautions:  If you experience chest pain or shortness of breath - call 911 immediately for transfer to the hospital emergency department!! ° °If you develop a fever greater that 101 F, purulent drainage from wound, increased redness or drainage from wound, or calf pain -- Call the office at [phone]                                                °Follow- Up Appointment:  Please call for an appointment to be seen in 2 weeks Julian - [phone] ° ° ° ° ° °

## 2014-11-14 NOTE — Op Note (Signed)
11/14/2014  10:06 AM  PATIENT:  [name] [name]    PRE-OPERATIVE DIAGNOSIS:  Left shoulder rotator cuff arthropathy  POST-OPERATIVE DIAGNOSIS:  Same  PROCEDURE: Left REVERSE TOTAL SHOULDER ARTHROPLASTY  SURGEON:  Eulas Post, MD  First assistant: Lindwood Qua, PA-C present and scrubbed throughout the case, critical for completion in a timely fashion, and for retraction, instrumentation, and closure.  Second ASSISTANT: Janace Litten, OPA-C,   ANESTHESIA:   General  PREOPERATIVE INDICATIONS:  [name] [name] is a  81 y.o. male who had chronic left rotator cuff arthropathy and failed conservative measures and elected for surgical management.  The risks benefits and alternatives were discussed with the patient preoperatively including but not limited to the risks of infection, bleeding, nerve injury, cardiopulmonary complications, the need for revision surgery, dislocation, brachial plexus palsy, incomplete relief of pain, among others, and the patient was willing to proceed.  OPERATIVE IMPLANTS: Biomet size 11 humeral stem press-fit standard with a 44 mm reverse shoulder arthroplasty tray with a +3 liner and a 36 mm glenosphere with a mini baseplate and 4 locking screws and one central nonlocking screw.  OPERATIVE FINDINGS: The shoulder was extremely tight, had substantial chronic adhesions, no glenohumeral mobility prior to exposure, with chronic retracted supraspinatus and infraspinatus and subscapularis tear. The subscapularis was an extremely poor condition, although I did get a repair at the completion of the case, but it was certainly not very impressive substance. Bone quality was fair. The glenoid vault was extremely shallow and only accommodated between size 15 and 25 mm screws.  OPERATIVE PROCEDURE: The patient was brought to the operating room and placed in the supine position. General anesthesia was administered. IV antibiotics were given. Time out was performed. The upper  extremity was prepped and draped in usual sterile fashion. The patient was in a beachchair position. Deltopectoral approach was carried out. The biceps was chronically torn and not visualized, the groove was empty, and there was a large amount of synovitic macerated tissue anteriorly. The subscapularis was released off of the bone, however it was somewhat stringy, and chronically degenerative torn..   I then performed circumferential releases of the humerus, and then dislocated the head, and then reamed with the reamer to the above named size. The dislocation of the head was extremely difficult because of adhesions, and I did have to go into the subacromial space with a knife, as well as mobilize around completely posteriorly.  I then applied the jig, and cut the humeral head in 30 of retroversion, and then turned my attention to the glenoid. I did in fact have to cut the head twice in order to gain adequate access because the access to the glenoid was so restricted due to his adhesions despite releasing the labrum.  Deep retractors were placed, and I resected the labrum, and then placed a guidepin into the center position on the glenoid, with slight inferior inclination. I then reamed over the guidepin, and this created a small metaphyseal cancellus blush inferiorly, removing just the cartilage to the subchondral bone superiorly. The base plate was selected and impacted place, and then I secured it centrally with a nonlocking screw, and I had adequate purchase both inferiorly and superiorly. I placed a short locking screws on anterior and posterior aspects.  I then turned my attention to the glenosphere, and impacted this into place, placing slight inferior offset (set on B).   The glenoid sphere was completely seated, and had engagement of the [facility] taper. I then turned  my attention back to the humerus.  I sequentially broached, and then trialed, and was found to restore soft tissue tension, and it  had 2 finger tightness. Therefore the above named components were selected. The shoulder felt stable throughout functional motion.  Before I placed the real prosthesis I had also placed a total of 3 #2 Max braid through drill holes in the humerus for later subscapularis repair.  I then impacted the real prosthesis into place, as well as the real humeral tray, and reduced the shoulder. The shoulder had appropriate motion, and was stable, and I irrigated the wounds copiously.    I then used these to repair the subscapularis. This came down to bone although tissue quality was extremely poor.  I then irrigated the shoulder copiously once more, repaired the deltopectoral interval with Vicryl followed by subcutaneous Vicryl with Steri-Strips and sterile gauze for the skin. The patient was awakened and returned back in stable and satisfactory condition. There no complications and He tolerated the procedure well.

## 2014-11-14 NOTE — Progress Notes (Signed)
Notified Dr.Edwards of pt's blood pressure. Stated Dr. Jean RosenthalJackson will be in shortly to see pt. .[name] [name]

## 2014-11-14 NOTE — Progress Notes (Signed)
Utilization review completed.  

## 2014-11-14 NOTE — Anesthesia Procedure Notes (Addendum)
Procedure Name: Intubation Date/Time: 11/14/2014 7:44 AM Performed by: Adonis HousekeeperNGELL, JANNA M Pre-anesthesia Checklist: Patient identified, Emergency Drugs available, Suction available and Patient being monitored Patient Re-evaluated:Patient Re-evaluated prior to inductionOxygen Delivery Method: Circle system utilized Preoxygenation: Pre-oxygenation with 100% oxygen Intubation Type: IV induction Ventilation: Mask ventilation without difficulty Laryngoscope Size: Glidescope and 4 Grade View: Grade I Tube type: Oral Tube size: 7.5 mm Number of attempts: 1 Airway Equipment and Method: Stylet and Video-laryngoscopy Placement Confirmation: ETT inserted through vocal cords under direct vision,  positive ETCO2 and breath sounds checked- equal and bilateral Secured at: 22 cm Tube secured with: Tape Dental Injury: Teeth and Oropharynx as per pre-operative assessment  Difficulty Due To: Difficulty was anticipated   Anesthesia Regional Block:  Interscalene brachial plexus block  Pre-Anesthetic Checklist: ,, timeout performed, Correct Patient, Correct Site, Correct Laterality, Correct Procedure, Correct Position, site marked, Risks and benefits discussed,  Surgical consent,  Pre-op evaluation,  At surgeon's request and post-op pain management  Laterality: Left and Upper  Prep: chloraprep       Needles:  Injection technique: Single-shot  Needle Type: Echogenic Stimulator Needle     Needle Length: 4cm 4 cm Needle Gauge: 22 and 22 G    Additional Needles:  Procedures: nerve stimulator Interscalene brachial plexus block  Nerve Stimulator or Paresthesia:  Response: forearm twitch, 0.4 mA, 0.1 ms,   Additional Responses:   Narrative:  Start time: 11/14/2014 7:19 AM End time: 11/14/2014 7:24 AM Injection made incrementally with aspirations every 5 mL.  Performed by: Personally  Anesthesiologist: Erling CruzJACKSON, E. Jonae Renshaw  Additional Notes: Pt identified in Holding room.  Monitors applied.  Working IV access confirmed. Sterile prep L neck.  #22ga PNS to forearm twitch at 0.5140mA threshold.  30cc 0.5% Bupivacaine with 1:200k epi injected incrementally after negative test dose.  Patient asymptomatic, VSS, no heme aspirated, tolerated well.  Sandford Craze Garik Diamant, MD

## 2014-11-14 NOTE — Anesthesia Postprocedure Evaluation (Signed)
  Anesthesia Post-op Note  Patient: [name] [name]  Procedure(s) Performed: Procedure(s): REVERSE SHOULDER ARTHROPLASTY (Left)  Patient Location: PACU  Anesthesia Type:GA combined with regional for post-op pain  Level of Consciousness: awake, alert , oriented and patient cooperative  Airway and Oxygen Therapy: Patient Spontanous Breathing and Patient connected to nasal cannula oxygen  Post-op Pain: none  Post-op Assessment: Post-op Vital signs reviewed, Patient's Cardiovascular Status Stable, Respiratory Function Stable, Patent Airway, No signs of Nausea or vomiting and Pain level controlled  Post-op Vital Signs: Reviewed and stable  Last Vitals:  Filed Vitals:   11/14/14 1130  BP: 108/57  Pulse: 72  Temp: 36.7 C  Resp: 15    Complications: No apparent anesthesia complications

## 2014-11-14 NOTE — Anesthesia Preprocedure Evaluation (Addendum)
Anesthesia Evaluation  Patient identified by MRN, date of birth, ID band Patient awake    Reviewed: Allergy & Precautions, NPO status , Patient's Chart, lab work & pertinent test results, reviewed documented beta blocker date and time   History of Anesthesia Complications Negative for: history of anesthetic complications  Airway Mallampati: III  TM Distance: >3 FB Neck ROM: Limited  Mouth opening: Limited Mouth Opening  Dental  (+) Missing, Caps, Implants, Dental Advisory Given   Pulmonary neg pulmonary ROS, PE breath sounds clear to auscultation        Cardiovascular hypertension, Pt. on medications and Pt. on home beta blockers - anginaDVT Rhythm:Regular Rate:Normal  '11 ECHO: EF 60-65%, valves OK   Neuro/Psych negative neurological ROS     GI/Hepatic Neg liver ROS, GERD-  Controlled,  Endo/Other  diabetes (glu 101), Oral Hypoglycemic Agents  Renal/GU negative Renal ROS     Musculoskeletal   Abdominal   Peds  Hematology negative hematology ROS (+) REFUSES BLOOD PRODUCTS, JEHOVAH'S WITNESS  Anesthesia Other Findings   Reproductive/Obstetrics                          Anesthesia Physical Anesthesia Plan  ASA: III  Anesthesia Plan: General   Post-op Pain Management:    Induction: Intravenous  Airway Management Planned: Oral ETT and Video Laryngoscope Planned  Additional Equipment:   Intra-op Plan:   Post-operative Plan: Extubation in OR  Informed Consent: I have reviewed the patients History and Physical, chart, labs and discussed the procedure including the risks, benefits and alternatives for the proposed anesthesia with the patient or authorized representative who has indicated his/her understanding and acceptance.   Dental advisory given  Plan Discussed with: CRNA and Surgeon  Anesthesia Plan Comments: (Plan routine monitors, GETA with interscalene block for post op  analgesia)        Anesthesia Quick Evaluation

## 2014-11-14 NOTE — H&P (Signed)
PREOPERATIVE H&P  Chief Complaint: Left shoulder rotator cuff arthropathy  HPI: [name] [name] is a 81 y.o. male who presents for preoperative history and physical with a diagnosis of left shoulder rotator cuff arthropathy. Symptoms are rated as moderate to severe, and have been worsening.  This is significantly impairing activities of daily living.  He has elected for surgical management. He has failed injections, activity modification, anti-inflammatories, and assistive devices.  Preoperative X-rays demonstrate end stage degenerative changes with osteophyte formation, loss of joint space, subchondral sclerosis. He has a high riding humerus with chronic superior migration.   Past Medical History  Diagnosis Date  . Blood clotting tendency     right leg went to lung  . Diabetes mellitus without complication   . History of kidney stones   . Arthritis    Past Surgical History  Procedure Laterality Date  . Total knee arthroplasty      bilat  . Rotator cuff repair      bilat  . Cataract extraction w/ intraocular lens  implant, bilateral     History   Social History  . Marital Status: Married    Spouse Name: N/A  . Number of Children: N/A  . Years of Education: N/A   Social History Main Topics  . Smoking status: Never Smoker   . Smokeless tobacco: Not on file  . Alcohol Use: No  . Drug Use: No  . Sexual Activity: Not on file   Other Topics Concern  . None   Social History Narrative   History reviewed. No pertinent family history. Allergies  Allergen Reactions  . Other Other (See Comments)    Blood Products-Jehovas Witness Unknown Pain Medication caused hallucinations   Prior to Admission medications   Medication Sig Start Date End Date Taking? Authorizing Provider  acetaminophen (TYLENOL) 650 MG CR tablet Take 650 mg by mouth every 8 (eight) hours as needed for pain.   Yes Historical Provider, MD  citalopram (CELEXA) 20 MG tablet Take 20 mg by mouth daily.  05/11/14   Yes Historical Provider, MD  glipiZIDE (GLUCOTROL) 5 MG tablet Take 5 mg by mouth daily before breakfast.   Yes Historical Provider, MD  lisinopril (PRINIVIL,ZESTRIL) 5 MG tablet Take 5 mg by mouth daily.  02/06/14  Yes Historical Provider, MD  metFORMIN (GLUMETZA) 500 MG (MOD) 24 hr tablet Take 2,000 mg by mouth at bedtime.  04/28/14  Yes Historical Provider, MD  metoprolol (LOPRESSOR) 100 MG tablet Take 50 mg by mouth 2 (two) times daily.  04/26/14  Yes Historical Provider, MD  rivaroxaban (XARELTO) 20 MG TABS tablet Take 20 mg by mouth daily with supper.    Yes Historical Provider, MD     Positive ROS: All other systems have been reviewed and were otherwise negative with the exception of those mentioned in the HPI and as above.  Physical Exam: General: Alert, no acute distress Cardiovascular: No pedal edema Respiratory: No cyanosis, no use of accessory musculature GI: No organomegaly, abdomen is soft and non-tender Skin: No lesions in the area of chief complaint Neurologic: Sensation intact distally Psychiatric: Patient is competent for consent with normal mood and affect Lymphatic: No axillary or cervical lymphadenopathy  MUSCULOSKELETAL: Left shoulder active motion is 0-20. He has profound weakness with rotator cuff testing. Mild functional infraspinatus although it's not great.    Assessment: Left shoulder rotator cuff arthropathy  Plan: Plan for Procedure(s): Left reverse total shoulder replacement   The risks benefits and alternatives were discussed with the  patient including but not limited to the risks of nonoperative treatment, versus surgical intervention including infection, bleeding, nerve injury, dislocation,   blood clots, cardiopulmonary complications, morbidity, mortality, among others, and they were willing to proceed.   Eulas Post, MD Cell [phone]   11/14/2014 7:15 AM

## 2014-11-14 NOTE — Transfer of Care (Signed)
Immediate Anesthesia Transfer of Care Note  Patient: [name] [name]  Procedure(s) Performed: Procedure(s): REVERSE SHOULDER ARTHROPLASTY (Left)  Patient Location: PACU  Anesthesia Type:General and Regional  Level of Consciousness: awake, alert , oriented and patient cooperative  Airway & Oxygen Therapy: Patient Spontanous Breathing and Patient connected to nasal cannula oxygen  Post-op Assessment: Report given to RN, Post -op Vital signs reviewed and stable, Patient moving all extremities and Patient moving all extremities X 4  Post vital signs: Reviewed and stable  Last Vitals:  Filed Vitals:   11/14/14 0622  BP: 218/83  Pulse:   Temp:   Resp:     Complications: No apparent anesthesia complications

## 2014-11-14 NOTE — Consult Note (Signed)
[facility] Medical Consultation  [name] [name] ZOX:[id number] DOB: [birth year] DOA: 11/14/2014 PCP: Burnis Medin, PA-C   Requesting physician: Dorthula Nettles, orthopedics Date of consultation: 11/14/14 Reason for consultation: Elevated blood pressures  Impression/Recommendations Principal Problem:   Left rotator cuff tear arthropathy: Status post repair Active Problems:   Diabetes mellitus without complication: Blood sugar stable, A1c notes good control    Blood clotting tendency: Restart Xarelto as per orthopedics    Malignant hypertension: Unclear etiology. Based off of EKG, I'm not convinced that he has markedly elevated blood pressures chronically. I also doubt occult alcohol use so do not suspect secondary withdrawal.. He does have a history of diastolic heart failure, so there could be some volume overload which caused elevated blood pressures. ith mild blood loss and pain control, blood pressure much lower now. Checking BNP    Chronic diastolic heart failure: Checking BNP, if elevated recheck echocardiogram    CKD (chronic kidney disease) stage 3, GFR 30-59 ml/min: Looks to be at baseline   My partner will followup again tomorrow. Please contact me if I can be of assistance in the meanwhile. Thank you for this consultation.  Chief Complaint: Left shoulder pain  HPI:  81 year old male with past mental history of diabetes mellitus, diastolic heart failure from echocardiogram in 2011 and stage III chronic kidney disease admitted on 2/16 for elective left rotator cuff tear repair. Preop and during surgery patient noted to have markedly elevated blood pressures with systolics in the 200s. Hospitalists called for further evaluation. Postprocedure, patient's blood pressures have been much more in the normal range and even slightly low.  Patient seen this evening on the floor.  He is not sure what happened. States his blood pressures never liked that. He denies any heavy  drinking and seems sincere. He says he very compliant with his medications and even took this morning. He states he was feeling very anxious prior to surgery though.  Review of Systems:  Patient with some mild soreness in left arm. Otherwise doing okay. Denies any headaches, vision changes, dysphagia, chest pain, palpitations, shortness of breath, wheeze, cough, bowel pain, hematuria, dysuria, constipation, diarrhea, focal extremity numbness weakness or pain other than described above. Review of systems is otherwise negative.  Past Medical History  Diagnosis Date  . Blood clotting tendency     right leg went to lung  . Diabetes mellitus without complication   . History of kidney stones   . Arthritis   . Left rotator cuff tear arthropathy 11/14/2014   Past Surgical History  Procedure Laterality Date  . Total knee arthroplasty      bilat  . Rotator cuff repair      bilat  . Cataract extraction w/ intraocular lens  implant, bilateral     Social History:  reports that he has never smoked. He does not have any smokeless tobacco history on file. He reports that he very rarely if ever drink alcohol or use illicit drugs.  Allergies  Allergen Reactions  . Other Other (See Comments)    Blood Products-[place] Unknown Pain Medication caused hallucinations   Family history: None. Confirmed with patient.  Prior to Admission medications   Medication Sig Start Date End Date Taking? Authorizing Provider  acetaminophen (TYLENOL) 650 MG CR tablet Take 650 mg by mouth every 8 (eight) hours as needed for pain.   Yes Historical Provider, MD  citalopram (CELEXA) 20 MG tablet Take 20 mg by mouth daily.  05/11/14  Yes Historical Provider,  MD  glipiZIDE (GLUCOTROL) 5 MG tablet Take 5 mg by mouth daily before breakfast.   Yes Historical Provider, MD  lisinopril (PRINIVIL,ZESTRIL) 5 MG tablet Take 5 mg by mouth daily.  02/06/14  Yes Historical Provider, MD  metFORMIN (GLUMETZA) 500 MG (MOD) 24 hr  tablet Take 2,000 mg by mouth at bedtime.  04/28/14  Yes Historical Provider, MD  metoprolol (LOPRESSOR) 100 MG tablet Take 50 mg by mouth 2 (two) times daily.  04/26/14  Yes Historical Provider, MD  rivaroxaban (XARELTO) 20 MG TABS tablet Take 20 mg by mouth daily with supper.    Yes Historical Provider, MD  baclofen (LIORESAL) 10 MG tablet Take 1 tablet (10 mg total) by mouth 3 (three) times daily. As needed for muscle spasm 11/14/14   Eulas PostJoshua P Landau, MD  HYDROcodone-acetaminophen [facility](NORCO) 10-325 MG per tablet Take 1-2 tablets by mouth every 6 (six) hours as needed. 11/14/14   Eulas PostJoshua P Landau, MD  ondansetron (ZOFRAN) 4 MG tablet Take 1 tablet (4 mg total) by mouth every 8 (eight) hours as needed for nausea or vomiting. 11/14/14   Eulas PostJoshua P Landau, MD  sennosides-docusate sodium (SENOKOT-S) 8.6-50 MG tablet Take 2 tablets by mouth daily. 11/14/14   Eulas PostJoshua P Landau, MD   Physical Exam: Blood pressure 99/55, pulse 79, temperature 97.9 F (36.6 C), temperature source Oral, resp. rate 20, SpO2 97 %. Filed Vitals:   11/14/14 1749  BP: 99/55  Pulse: 79  Temp: 97.9 F (36.6 C)  Resp: 20     General:  Alert and oriented 3, no acute distress  Eyes: Sclera nonicteric, extraocular movements are intact  ENT: Normocephalic, atraumatic, mucous members are moist  Neck: No JVD  Cardiovascular: Regular rate and rhythm, S1-S2  Respiratory: Clear to auscultation bilaterally  Abdomen: Soft, obese, nontender, bowel sounds  Skin: No skin breaks tears or lesions  Musculoskeletal: No clubbing or cyanosis, trace edema. Arm in sling  Psychiatric: Patient is appropriate, no evidence of psychoses Neurologic: No focal deficits  Labs on Admission:  Basic Metabolic Panel: No results for input(s): NA, K, CL, CO2, GLUCOSE, BUN, CREATININE, CALCIUM, MG, PHOS in the last 168 hours. Liver Function Tests: No results for input(s): AST, ALT, ALKPHOS, BILITOT, PROT, ALBUMIN in the last 168 hours. No results for  input(s): LIPASE, AMYLASE in the last 168 hours. No results for input(s): AMMONIA in the last 168 hours. CBC: No results for input(s): WBC, NEUTROABS, HGB, HCT, MCV, PLT in the last 168 hours. Cardiac Enzymes: No results for input(s): CKTOTAL, CKMB, CKMBINDEX, TROPONINI in the last 168 hours. BNP: Invalid input(s): POCBNP CBG:  Recent Labs Lab 11/14/14 0601 11/14/14 1044 11/14/14 1635  GLUCAP 101* 157* 234*    Radiological Exams on Admission: Dg Shoulder Left  11/14/2014   CLINICAL DATA:  Total shoulder arthroplasty, postoperative exam  EXAM: LEFT SHOULDER - 2+ VIEW  COMPARISON:  Left shoulder CT 07/14/2014  FINDINGS: A single projection demonstrates left shoulder arthroplasty. No evidence for hardware failure. Soft tissue gas is noted. A focal radiopacity projecting over the soft tissues adjacent to the humeral neck is identified, of unclear clinical significance. No fracture line is seen. Small left pleural effusion is noted.  IMPRESSION: Left total shoulder arthroplasty. Focal radiopacity projecting over the soft tissues of the level of the left humeral neck, of unclear clinical significance.   Electronically Signed   By: Christiana PellantGretchen  Green M.D.   On: 11/14/2014 11:14    EKG: Independently reviewed. Preop EKG from 2/5: Normal sinus rhythm  with borderline criteria for LVH  Time spent: 35 minutes  Hollice Espy [facility] Pager [phone]  If 7PM-7AM, please contact night-coverage [email or web address] Password [facility] 11/14/2014, 8:05 PM

## 2014-11-14 NOTE — Addendum Note (Signed)
Addendum  created 11/14/14 1519 by Germaine PomfretE. Lam Bjorklund, MD   Modules edited: Anesthesia Medication Administration

## 2014-11-14 NOTE — Progress Notes (Signed)
Patient arrived to 5N16 at approx. 1200 via bed from PACU. Patient, awake, alert and oriented x4, denies pain or discomfort. Vitals obtained, assessment performed, patient oriented to bed, room, unit. Will continue to monitor. Asher Muir- Tamar Lipscomb,RN

## 2014-11-15 ENCOUNTER — Encounter (HOSPITAL_COMMUNITY): Payer: Self-pay | Admitting: Orthopedic Surgery

## 2014-11-15 LAB — GLUCOSE, CAPILLARY
GLUCOSE-CAPILLARY: 187 mg/dL — AB (ref 70–99)
Glucose-Capillary: 224 mg/dL — ABNORMAL HIGH (ref 70–99)
Glucose-Capillary: 164 mg/dL — ABNORMAL HIGH (ref 70–99)
Glucose-Capillary: 117 mg/dL — ABNORMAL HIGH (ref 70–99)

## 2014-11-15 LAB — CBC
HCT: 36.2 % — ABNORMAL LOW (ref 39.0–52.0)
HEMOGLOBIN: 11.4 g/dL — AB (ref 13.0–17.0)
MCH: 29.8 pg (ref 26.0–34.0)
MCHC: 31.5 g/dL (ref 30.0–36.0)
MCV: 94.5 fL (ref 78.0–100.0)
Platelets: 211 10*3/uL (ref 150–400)
RBC: 3.83 MIL/uL — ABNORMAL LOW (ref 4.22–5.81)
RDW: 13.7 % (ref 11.5–15.5)
WBC: 10.9 10*3/uL — AB (ref 4.0–10.5)

## 2014-11-15 LAB — BASIC METABOLIC PANEL
ANION GAP: 4 — AB (ref 5–15)
BUN: 23 mg/dL (ref 6–23)
CALCIUM: 9 mg/dL (ref 8.4–10.5)
CHLORIDE: 101 mmol/L (ref 96–112)
CO2: 30 mmol/L (ref 19–32)
Creatinine, Ser: 1.33 mg/dL (ref 0.50–1.35)
GFR calc non Af Amer: 50 mL/min — ABNORMAL LOW (ref >90)
GFR, EST AFRICAN AMERICAN: 57 mL/min — AB (ref >90)
Glucose, Bld: 189 mg/dL — ABNORMAL HIGH (ref 70–99)
Potassium: 4.5 mmol/L (ref 3.5–5.1)
SODIUM: 135 mmol/L (ref 135–145)

## 2014-11-15 MED ORDER — HYDROCODONE-ACETAMINOPHEN 5-325 MG PO TABS
1.0000 | ORAL_TABLET | Freq: Four times a day (QID) | ORAL | Status: DC | PRN
  Administered 2014-11-15: 1 via ORAL
  Filled 2014-11-15: qty 2

## 2014-11-15 MED ORDER — HYDROCODONE-ACETAMINOPHEN 10-325 MG PO TABS
1.0000 | ORAL_TABLET | Freq: Four times a day (QID) | ORAL | Status: DC | PRN
  Administered 2014-11-15 – 2014-11-16 (×3): 2 via ORAL
  Filled 2014-11-15 (×3): qty 2

## 2014-11-15 NOTE — Progress Notes (Signed)
[facility] Consult Note   [name] [name] Whichard QIO:[id number] DOB: 12/18/1933 DOA: 11/14/2014 PCP: Burnis MedinFULBRIGHT, VIRGINIA E, PA-C  Brief narrative: [name] [name] Klingensmith is a 81 y.o. male with past mental history of diabetes mellitus, diastolic heart failure from echocardiogram in 2011 and stage III chronic kidney disease admitted on 2/16 for elective left rotator cuff tear repair. The hospitalist services consult to further evaluate his elevated blood pressure.   Subjective: He has complaints of pain in his left shoulder today but otherwise is feeling well. No shortness of breath headache or chest pain noted. He feels that his blood pressure was uncontrolled due to severe anxiety.  Assessment/Plan: Principal Problem:   Left rotator cuff tear arthropathy -Management per primary service  Active Problems:    Malignant hypertension -This issue has now resolved-we'll continue with his home dosages of lisinopril and metoprolol and continue to follow -Pain control is important and further control of his blood pressure.    Chronic diastolic heart failure -Appears to be euvolemic at this time    CKD (chronic kidney disease) stage 3, GFR 30-59 ml/min - At baseline    Diabetes mellitus without complication - He takes glipizide and metformin at home-glipizide has been continued but metformin is on hold -He is also on a sliding scale with meals    Blood clotting tendency - Continue Xarelto at the dosage she takes at home  Code Status: Full code  Antibiotics: Anti-infectives    Start     Dose/Rate Route Frequency Ordered Stop   11/14/14 1215  ceFAZolin (ANCEF) IVPB 1 g/50 mL premix     1 g 100 mL/hr over 30 Minutes Intravenous Every 6 hours 11/14/14 1203 11/15/14 0320   11/14/14 0600  ceFAZolin (ANCEF) IVPB 2 g/50 mL premix     2 g 100 mL/hr over 30 Minutes Intravenous On call to O.R. 11/13/14 1338 11/14/14 0805         Objective: Filed Weights   11/14/14 1900  Weight: 91.536 kg (201  lb 12.8 oz)    Intake/Output Summary (Last 24 hours) at 11/15/14 1116 Last data filed at 11/14/14 2259  Gross per 24 hour  Intake      0 ml  Output    600 ml  Net   -600 ml     Vitals Filed Vitals:   11/14/14 1749 11/14/14 1900 11/14/14 2053 11/15/14 0421  BP: 99/55  110/62 149/71  Pulse: 79  76 71  Temp: 97.9 F (36.6 C)  98.2 F (36.8 C) 97.8 F (36.6 C)  TempSrc: Oral     Resp: 20  20 20   Height:  5\' 8"  (1.727 m)    Weight:  91.536 kg (201 lb 12.8 oz)    SpO2: 97%  98% 93%    Exam: General: Awake alert oriented 3, No acute respiratory distress Lungs: Clear to auscultation bilaterally without wheezes or crackles Cardiovascular: Regular rate and rhythm without murmur gallop or rub normal S1 and S2 Abdomen: Nontender, nondistended, soft, bowel sounds positive, no rebound, no ascites, no appreciable mass Extremities: No significant cyanosis, clubbing, or edema bilateral lower extremities- left arm in sling  Data Reviewed: Basic Metabolic Panel:  Recent Labs Lab 11/15/14 0604  NA 135  K 4.5  CL 101  CO2 30  GLUCOSE 189*  BUN 23  CREATININE 1.33  CALCIUM 9.0   Liver Function Tests: No results for input(s): AST, ALT, ALKPHOS, BILITOT, PROT, ALBUMIN in the last 168 hours. No results for input(s): LIPASE, AMYLASE in  the last 168 hours. No results for input(s): AMMONIA in the last 168 hours. CBC:  Recent Labs Lab 11/15/14 0604  WBC 10.9*  HGB 11.4*  HCT 36.2*  MCV 94.5  PLT 211   Cardiac Enzymes: No results for input(s): CKTOTAL, CKMB, CKMBINDEX, TROPONINI in the last 168 hours. BNP (last 3 results)  Recent Labs  11/14/14 2112  BNP 152.2*    ProBNP (last 3 results) No results for input(s): PROBNP in the last 8760 hours.  CBG:  Recent Labs Lab 11/14/14 0601 11/14/14 1044 11/14/14 1635 11/14/14 2135 11/15/14 0631  GLUCAP 101* 157* 234* 174* 187*    No results found for this or any previous visit (from the past 240 hour(s)).    Studies:  Recent x-ray studies have been reviewed in detail by the Attending Physician  Scheduled Meds:  Scheduled Meds: . citalopram  20 mg Oral Daily  . docusate sodium  100 mg Oral BID  . glipiZIDE  5 mg Oral QAC breakfast  . insulin aspart  0-15 Units Subcutaneous TID WC  . lisinopril  5 mg Oral Daily  . metoprolol  50 mg Oral BID  . rivaroxaban  20 mg Oral Q supper  . senna  1 tablet Oral BID   Continuous Infusions: . sodium chloride 50 mL/hr at 11/14/14 2248    Time spent on care of this patient: 35 minutes Jonael Paradiso, MD 11/15/2014, 11:16 AM  LOS: 1 day   [facility] Office  [phone] Pager - Text Page per [email or web address]  If 7PM-7AM, please contact night-coverage [email or web address]

## 2014-11-15 NOTE — Progress Notes (Signed)
Patient ID: [name] [name], male   DOB: 05/08/1934, 81 y.o.   MRN: [id number]     Subjective:  Patient reports pain as mild.  Patient complains of the medicine making him too loopy.  Denies any CP or SOB  Objective:   VITALS:   Filed Vitals:   11/14/14 1749 11/14/14 1900 11/14/14 2053 11/15/14 0421  BP: 99/55  110/62 149/71  Pulse: 79  76 71  Temp: 97.9 F (36.6 C)  98.2 F (36.8 C) 97.8 F (36.6 C)  TempSrc: Oral     Resp: 20  20 20   Height:  5\' 8"  (1.727 m)    Weight:  91.536 kg (201 lb 12.8 oz)    SpO2: 97%  98% 93%    ABD soft Sensation intact distally Dorsiflexion/Plantar flexion intact Incision: dressing C/D/I and no drainage Good wrist and hand motion.  Lab Results  Component Value Date   WBC 10.9* 11/15/2014   HGB 11.4* 11/15/2014   HCT 36.2* 11/15/2014   MCV 94.5 11/15/2014   PLT 211 11/15/2014   BMET    Component Value Date/Time   NA 139 11/03/2014 1522   K 3.9 11/03/2014 1522   CL 105 11/03/2014 1522   CO2 27 11/03/2014 1522   GLUCOSE 145* 11/03/2014 1522   BUN 23 11/03/2014 1522   CREATININE 1.17 11/03/2014 1522   CALCIUM 9.5 11/03/2014 1522   GFRNONAA 58* 11/03/2014 1522   GFRAA 67* 11/03/2014 1522     Assessment/Plan: 1 Day Post-Op   Principal Problem:   Left rotator cuff tear arthropathy Active Problems:   Diabetes mellitus without complication   Blood clotting tendency   Malignant hypertension   Chronic diastolic heart failure   CKD (chronic kidney disease) stage 3, GFR 30-59 ml/min   Advance diet Up with therapy Plan for discharge tomorrow Continue sling at all times Dry dressing PRN May try plain tylenol for pain   Haskel KhanDOUGLAS PARRY, BRANDON 11/15/2014, 7:53 AM  Discussed and agree.  Dc dilaudid, minimize norco.  Teryl LucyJoshua Kaedence Connelly, MD Cell [phone]

## 2014-11-15 NOTE — Progress Notes (Addendum)
OT Evaluation  Evaluation limited by pain. Wife states she can not manage pt is he requires physical assistance. Nurse notified of need for pain meds. Will return this pm to complete eval and begin L elbow ROM and education regarding reverse TSA protocol.    11/15/14 1200  OT Visit Information  Last OT Received On 11/15/14  History of Present Illness s/p L reverse hemiarthroplasty  Precautions  Precautions Shoulder  Type of Shoulder Precautions no shoulder ROM. elbow/wrist/hand only. sling at all times  Shoulder Interventions Shoulder sling/immobilizer;Off for dressing/bathing/exercises;At all times  Precaution Booklet Issued Yes (comment)  Restrictions  Weight Bearing Restrictions Yes  LUE Weight Bearing NWB  Home Living  Family/patient expects to be discharged to: Private residence  Living Arrangements Spouse/significant other  Available Help at Discharge Available 24 hours/day  Type of Home House  Home Access Level entry  Home Layout One level  Bathroom Shower/Tub Walk-in shower;Curtain  Bathroom Toilet Handicapped height  Bathroom Accessibility Yes  How Accessible Accessible via walker  Home Equipment Walker - 2 wheels;Shower seat - built in;Shower seat;BSC;Grab bars - tub/shower;Grab bars - toilet  Prior Function  Level of Independence Independent with assistive device(s);Needs assistance (with straight cane)  Communication  Communication No difficulties  Pain Assessment  Pain Assessment 0-10  Pain Score 10  Pain Location L shoulder  Pain Descriptors / Indicators Aching  Pain Intervention(s) Limited activity within patient's tolerance;Monitored during session;Ice applied  Cognition  Arousal/Alertness Awake/alert  Behavior During Therapy WFL for tasks assessed/performed  Overall Cognitive Status Impaired/Different from baseline  Area of Impairment Safety/judgement;Awareness;Problem solving  Memory Decreased short-term memory  Safety/Judgement Decreased awareness of  safety;Decreased awareness of deficits  Awareness Intellectual  Problem Solving Slow processing  General Comments Wife states nocognitive deficits at baseline, however,pt with impaired cognition on eval. ? related to anesthesia/pain meds?  Upper Extremity Assessment  Upper Extremity Assessment LUE deficits/detail  LUE Deficits / Details Pt unable to tolerate any ROM due to pain  Lower Extremity Assessment  Lower Extremity Assessment Generalized weakness;Defer to PT evaluation (B TKA)  Cervical / Trunk Assessment  Cervical / Trunk Assessment Other exceptions  Cervical / Trunk Exceptions posterior bias  ADL  Overall ADL's  Needs assistance/impaired  Eating/Feeding Set up  Grooming Minimal assistance;Sitting  Upper Body Bathing Max A  Lower Body Bathing   Upper Body Dressing  Maximal assistance;Sitting  Lower Body Dressing   Toilet Transfer   Toileting- Clothing Manipulation and Hygiene   Functional mobility during ADLs   General ADL Comments significant functional decline. Wife states she is unable ot assist at this level of care. risk for falls. Educated on positioningof LUE in bed/chair.. Will assess ADL further next session if able  Bed Mobility  Overal bed mobility Needs Assistance  General bed mobility comments will assess later  Transfers  Overall transfer level Needs assistance  General transfer comment Pt unable to tolerate mobility due to pain. will return  Balance  Standing balance comment will assess  Exercises  Exercises Shoulder  Shoulder Instructions  Donning/doffing shirt without moving shoulder Maximal assistance  Method for sponge bathing under operated UE Maximal assistance  Donning/doffing sling/immobilizer Maximal assistance  Correct positioning of sling/immobilizer Maximal assistance  ROM for elbow, wrist and digits of operated UE Maximal assistance  Sling wearing schedule (on at all times/off for ADL's) Maximal assistance  Proper positioning of operated  UE when showering Maximal assistance  Positioning of UE while sleeping Maximal assistance  OT - End of Session  Activity Tolerance Patient limited by pain  Patient left in bed;with call bell/phone within reach;with family/visitor present  Nurse Communication Mobility status;Precautions;Weight bearing status;Patient requests pain meds  OT Assessment  OT Therapy Diagnosis  Generalized weakness;Altered mental status;Acute pain  OT Recommendation/Assessment Patient needs continued OT Services  OT Problem List Decreased strength;Decreased activity tolerance;Decreased range of motion;Impaired balance (sitting and/or standing);Decreased coordination;Decreased safety awareness;Decreased knowledge of precautions;Impaired UE functional use;Pain  Barriers to Discharge Decreased caregiver support  OT Plan  OT Frequency (ACUTE ONLY) Min 3X/week  OT Treatment/Interventions (ACUTE ONLY) Self-care/ADL training;Therapeutic exercise;DME and/or AE instruction;Therapeutic activities;Cognitive remediation/compensation;Patient/family education  OT Recommendation  Follow Up Recommendations SNF;Supervision/Assistance - 24 hour  OT Equipment None recommended by OT  Individuals Consulted  Consulted and Agree with Results and Recommendations Patient;Family member/caregiver  Family Member Consulted wife  Acute Rehab OT Goals  Patient Stated Goal to return home  OT Goal Formulation With patient  Time For Goal Achievement 11/29/14  Potential to Achieve Goals Good  OT Time Calculation  OT Start Time (ACUTE ONLY) 1210  OT Stop Time (ACUTE ONLY) 1228  OT Time Calculation (min) 18 min  OT General Charges  $OT Visit 1 Procedure  OT Evaluation  $Initial OT Evaluation Tier I 1 Procedure  Written Expression  Dominant Hand Right  [name] [name], OTR/L  [phone] 11/15/2014

## 2014-11-15 NOTE — Evaluation (Signed)
Physical Therapy Evaluation Patient Details Name: [name] [name] MRN: [id number] DOB: [birth year] Today's Date: 11/15/2014   History of Present Illness  81 y.o. male admitted to [facility] on 11/14/14 for elective left reverse total shoulder arthroplasty.  Pt with significant PMHx of blood clotting tendancy, DM, Bil RTC surgery, and bil TKA.   Clinical Impression  Pt is POD #1 and his mobility is significantly different than his baseline of mod I with a cane. He requires mod assist (two people for safety) to just walk in his room to the recliner chair.  He is very unbalanced and even with HOB elevated (he has a bed at home that can do this) he needs two person assist to get to sitting.  His wife has MS and cannot physically handle him at home in his current state/presentation. SNF level rehab is recommended at discharge until he can get more steady and d/c home safely.      Follow Up Recommendations SNF    Equipment Recommendations  None recommended by PT    Recommendations for Other Services   NA    Precautions / Restrictions Precautions Precautions: Shoulder;Fall Type of Shoulder Precautions: no shoulder ROM. elbow/wrist/hand only. sling at all times Shoulder Interventions: Shoulder sling/immobilizer;Off for dressing/bathing/exercises;At all times Precaution Booklet Issued: Yes (comment) Restrictions Weight Bearing Restrictions: Yes LUE Weight Bearing: Non weight bearing      Mobility  Bed Mobility Overal bed mobility: +2 for physical assistance;Needs Assistance Bed Mobility: Supine to Sit     Supine to sit: +2 for physical assistance;Mod assist     General bed mobility comments: Two person mod assist from right side of bed with HOB ~50 degrees (pt reports he has a temperpedic mattress that the [facility] elevates and ha has a railing.  Pt had significant difficulty getting to sitting both moving his legs over EOB.  Pt needed two person assist (on in front helping him pull up with his right  hand and one in back supporting his trunk to prevent posterior LOB) to get to sitting EOB.    Transfers Overall transfer level: Needs assistance Equipment used: 1 person hand held assist Transfers: Sit to/from Stand Sit to Stand: +2 safety/equipment;Mod assist         General transfer comment: Two person mod assist for safety to get to standing.  Pt immediately has a posterior LOB/moment.  He does know he is falling backwards, but has difficulty correcting on his own.    Ambulation/Gait Ambulation/Gait assistance: +2 safety/equipment;Mod assist Ambulation Distance (Feet): 15 Feet Assistive device: 1 person hand held assist Gait Pattern/deviations: Step-through pattern;Decreased step length - right;Leaning posteriorly     General Gait Details: Pt with significant gait instability requiring mod physical assist.  Second person for safety and line management.  Pt is walking with posterior stagger and shuffling gait pattern.  His trunk is rotated to the left and he is not progressing his right foot forward as much as he is his left foot. He had significant difficulty backing up to the recliner chair due to posterior LOB.  Max verbal cues for foot placement during gait.           Balance Overall balance assessment: Needs assistance Sitting-balance support: Feet supported;Single extremity supported Sitting balance-Leahy Scale: Poor Sitting balance - Comments: Min assist EOB to prevent posterior LOB once pt's feet were supported by the floor. Pt only able to hold himself up with one arm.  Postural control: Posterior lean Standing balance support: Single extremity supported  Standing balance-Leahy Scale: Poor Standing balance comment: Mod hand held assist to prevent LOB in standing.                              Pertinent Vitals/Pain Pain Assessment: 0-10 Pain Score: 10-Worst pain ever Pain Location: left shoulder Pain Descriptors / Indicators: Aching;Burning Pain  Intervention(s): Limited activity within patient's tolerance;Monitored during session;Repositioned    Home Living Family/patient expects to be discharged to:: Private residence Living Arrangements: Spouse/significant other Available Help at Discharge: Available 24 hours/day (supervision) Type of Home: House Home Access: Level entry     Home Layout: One level Home Equipment: Environmental consultantWalker - 2 wheels;Shower seat - built in;Shower seat;Bedside commode;Grab bars - tub/shower;Grab bars - toilet;Cane - quad      Prior Function Level of Independence: Independent with assistive device(s)         Comments: pt walks with "three pronged" cane at baseline.  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  Normally very independent.      Hand Dominance   Dominant Hand: Right    Extremity/Trunk Assessment   Upper Extremity Assessment: Defer to OT evaluation           Lower Extremity Assessment: RLE deficits/detail;LLE deficits/detail RLE Deficits / Details: generalized weakness, however, during gait pt has tendancy for right leg to lag behind left leg (no foot drag, almost walking sideways).  LLE Deficits / Details: generalized weakness. Flexed knee gait, seems very stiff bil legs.   Cervical / Trunk Assessment: Normal  Communication   Communication: No difficulties  Cognition Arousal/Alertness: Awake/alert Behavior During Therapy: WFL for tasks assessed/performed Overall Cognitive Status: Within Functional Limits for tasks assessed                         Exercises Other Exercises Other Exercises: Encouraged as much leg movement as possible while sitting up in the recliner chair to avoid stiffenss.  Encouraged ankle pumps, LAQs, and seated marches.        Assessment/Plan    PT Assessment Patient needs continued PT services  PT Diagnosis Difficulty walking;Abnormality of gait;Generalized weakness;Acute pain   PT Problem List Decreased strength;Decreased activity  tolerance;Decreased balance;Decreased mobility;Decreased knowledge of use of DME;Decreased knowledge of precautions;Pain  PT Treatment Interventions DME instruction;Gait training;Functional mobility training;Therapeutic activities;Therapeutic exercise;Balance training;Neuromuscular re-education;Patient/family education;Modalities   PT Goals (Current goals can be found in the Care Plan section) Acute Rehab PT Goals Patient Stated Goal: to get better and back to normal.  PT Goal Formulation: With patient/family Time For Goal Achievement: 11/22/14 Potential to Achieve Goals: Good    Frequency Min 5X/week   Barriers to discharge Decreased caregiver support Pt's wife has MS and can only provide supervision at discharge.        End of Session Equipment Utilized During Treatment: Other (comment) (left arm sling) Activity Tolerance: Patient limited by pain Patient left: in chair;with call bell/phone within reach;with family/visitor present           Time: [id number] PT Time Calculation (min) (ACUTE ONLY): 30 min   Charges:   PT Evaluation $Initial PT Evaluation Tier I: 1 Procedure PT Treatments $Therapeutic Activity: 8-22 mins        Grover Robinson B. Mylasia Vorhees, PT, DPT [phone]   11/15/2014, 2:35 PM

## 2014-11-15 NOTE — Progress Notes (Signed)
Occupational Therapy Treatment Patient Details Name: [name] [name] MRN: [id number] DOB: 08/22/1934 Today's Date: 11/15/2014    History of present illness 81 y.o. male [facility] for elective left reverse total shoulder arthroplasty.  Pt with significant PMHx of blood clotting tendancy, DM, Bil RTC surgery, and bil TKA.    OT comments  Pt requires +2 for safe ambulation to bathroom with HHA. Began LUE elbow/wrist/hand A/AAROM and education on LUE protocol and precautions. Pt unable to recall any information at end of session. At this time, pt will need SNF for rehab at D/C.   Follow Up Recommendations  SNF;Supervision/Assistance - 24 hour    Equipment Recommendations  None recommended by OT    Recommendations for Other Services      Precautions / Restrictions Precautions Precautions: Shoulder;Fall Type of Shoulder Precautions: no shoulder ROM. elbow/wrist/hand only. sling at all times Shoulder Interventions: Shoulder sling/immobilizer;Off for dressing/bathing/exercises;At all times Precaution Booklet Issued: Yes (comment) Precaution Comments: Pt unable to recall any instructions from earlier session regarding LUE precautions Restrictions LUE Weight Bearing: Non weight bearing       Mobility Bed Mobility Overal bed mobility: Needs Assistance;+2 for physical assistance Bed Mobility: Sit to Supine     Supine to sit: +2 for physical assistance;Mod assist Sit to supine: Max assist;+2 for physical assistance   General bed mobility comments: Pt unable to scoot up in bed without max A. Required Max A tocontrol trunk onto bed and lift legs.    Transfers Overall transfer level: Needs assistance Equipment used: 1 person hand held assist Transfers: Sit to/from [employer] CorporationStand;Stand Pivot Transfers Sit to Stand: Mod assist Stand pivot transfers: Mod assist       General transfer comment: +2 for safe ambulation. Unsafe. sitting from diagonal and requiring max A to prevent  fallonto toilet    Balance Overall balance assessment: Needs assistance Sitting-balance support: Feet supported Sitting balance-Leahy Scale: Poor Sitting balance - Comments: Leaning R. unable to achieve midline postural control Postural control: Right lateral lean;Posterior lean Standing balance support: Single extremity supported;During functional activity Standing balance-Leahy Scale: Poor Standing balance comment: Mod hand held assist to prevent LOB in standing.                    ADL Overall ADL's : Needs assistance/impaired Eating/Feeding: Set up       Upper Body Bathing: Maximal assistance;Sitting   Lower Body Bathing: Maximal assistance;Sit to/from stand   Upper Body Dressing : Maximal assistance;Sitting   Lower Body Dressing: Maximal assistance;Sit to/from stand   Toilet Transfer: Moderate assistance;Ambulation;Grab bars;Comfort height toilet   Toileting- Clothing Manipulation and Hygiene: Moderate assistance;Sit to/from stand       Functional mobility during ADLs: Moderate assistance;Cueing for safety General ADL Comments: Max A with ADL. Began educating pt on compensatory techniques for ADL,       Vision    wears glasses                 Perception     Praxis      Cognition   Behavior During Therapy: WFL for tasks assessed/performed Overall Cognitive Status: Impaired/Different from baseline                  General Comments: Pt unsafe during mobility. Max vc for safety. Wife states this is not pt's baseline. Pt asking why he feels "so funny" (? related to pain meds)    Extremity/Trunk Assessment  Upper Extremity Assessment Upper Extremity Assessment: Defer  to OT evaluation      Cervical / Trunk Assessment Cervical / Trunk Assessment: Normal    Exercises Other Exercises Other Exercises: Encouraged as much leg movement as possible while sitting up in the recliner chair to avoid stiffenss.  Encouraged ankle pumps, LAQs, and  seated marches.   Donning/doffing shirt without moving shoulder: Maximal assistance Method for sponge bathing under operated UE: Maximal assistance Donning/doffing sling/immobilizer: Maximal assistance Correct positioning of sling/immobilizer: Maximal assistance ROM for elbow, wrist and digits of operated UE: Maximal assistance Sling wearing schedule (on at all times/off for ADL's): Maximal assistance Proper positioning of operated UE when showering: Maximal assistance Positioning of UE while sleeping: Maximal assistance   Shoulder Instructions Shoulder Instructions Donning/doffing shirt without moving shoulder: Maximal assistance Method for sponge bathing under operated UE: Maximal assistance Donning/doffing sling/immobilizer: Maximal assistance Correct positioning of sling/immobilizer: Maximal assistance ROM for elbow, wrist and digits of operated UE: Maximal assistance Sling wearing schedule (on at all times/off for ADL's): Maximal assistance Proper positioning of operated UE when showering: Maximal assistance Positioning of UE while sleeping: Maximal assistance     General Comments      Pertinent Vitals/ Pain       Pain Assessment: 0-10 Pain Score: 10-Worst pain ever Pain Location: L shoulder Pain Descriptors / Indicators: Aching;Constant Pain Intervention(s): Limited activity within patient's tolerance;Monitored during session;Repositioned;Ice applied  Home Living Family/patient expects to be discharged to:: Private residence Living Arrangements: Spouse/significant other Available Help at Discharge: Available 24 hours/day (supervision) Type of Home: House Home Access: Level entry     Home Layout: One level               Home Equipment: Walker - 2 wheels;Shower seat - built in;Shower seat;Bedside commode;Grab bars - tub/shower;Grab bars - toilet;Cane - quad          Prior Functioning/Environment Level of Independence: Independent with assistive device(s)         Comments: pt walks with "three pronged" cane at baseline.  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  Normally very independent.    Frequency Min 3X/week     Progress Toward Goals  OT Goals(current goals can now be found in the care plan section)  Progress towards OT goals: Progressing toward goals  Acute Rehab OT Goals Patient Stated Goal: to get better and back to normal.  OT Goal Formulation: With patient Time For Goal Achievement: 11/29/14 Potential to Achieve Goals: Good ADL Goals Pt Will Perform Upper Body Bathing: with min assist;sitting Pt Will Perform Upper Body Dressing: with min assist;sitting Pt Will Transfer to Toilet: ambulating;regular height toilet;grab bars;with min assist Pt Will Perform Toileting - Clothing Manipulation and hygiene: with min guard assist;sit to/from stand Pt/caregiver will Perform Home Exercise Program: Left upper extremity;With written HEP provided Additional ADL Goal #1: pt/wife independent with proper positioning LUE in sitting/supine and sling management  Plan Discharge plan remains appropriate    Co-evaluation                 End of Session Equipment Utilized During Treatment: Gait belt   Activity Tolerance Patient tolerated treatment well   Patient Left in bed;with call bell/phone within reach;with family/visitor present   Nurse Communication Mobility status;Precautions;Weight bearing status        Time: 6295-2841 OT Time Calculation (min): 33 min  Charges: OT General Charges $OT Visit: 1 Procedure OT Treatments $Self Care/Home Management : 23-37 mins  Jalasia Eskridge,HILLARY 11/15/2014, 4:14 PM   [facility], OTR/L  [phone]  11/15/2014 

## 2014-11-16 ENCOUNTER — Encounter (HOSPITAL_COMMUNITY): Payer: Self-pay | Admitting: Orthopedic Surgery

## 2014-11-16 ENCOUNTER — Inpatient Hospital Stay (HOSPITAL_COMMUNITY): Payer: Medicare Other

## 2014-11-16 LAB — URINALYSIS, ROUTINE W REFLEX MICROSCOPIC
BILIRUBIN URINE: NEGATIVE
GLUCOSE, UA: NEGATIVE mg/dL
HGB URINE DIPSTICK: NEGATIVE
KETONES UR: NEGATIVE mg/dL
Leukocytes, UA: NEGATIVE
Nitrite: NEGATIVE
PROTEIN: NEGATIVE mg/dL
Specific Gravity, Urine: 1.024 (ref 1.005–1.030)
Urobilinogen, UA: 0.2 mg/dL (ref 0.0–1.0)
pH: 5 (ref 5.0–8.0)

## 2014-11-16 LAB — GLUCOSE, CAPILLARY
GLUCOSE-CAPILLARY: 172 mg/dL — AB (ref 70–99)
GLUCOSE-CAPILLARY: 109 mg/dL — AB (ref 70–99)
Glucose-Capillary: 222 mg/dL — ABNORMAL HIGH (ref 70–99)

## 2014-11-16 MED ORDER — LISINOPRIL 10 MG PO TABS: 10.0000 mg | ORAL_TABLET | Freq: Every day | ORAL | Status: DC

## 2014-11-16 MED ORDER — LISINOPRIL 5 MG PO TABS
5.0000 mg | ORAL_TABLET | Freq: Every day | ORAL | Status: DC
  Filled 2014-11-16: qty 1

## 2014-11-16 MED ORDER — HYDRALAZINE HCL 20 MG/ML IJ SOLN: 10.0000 mg | Freq: Four times a day (QID) | INTRAMUSCULAR | Status: DC | PRN

## 2014-11-16 NOTE — Progress Notes (Signed)
PT Cancellation Note  Patient Details Name: [name] [name] MRN: [id number] DOB: 09/14/1934   Cancelled Treatment:    Reason Eval/Treat Not Completed: Patient at procedure or test/unavailable.  Pt leaving with transporters to go to [place]. PT will try back later.  Thanks,    Rollene Rotundaebecca B. Abdo Denault, PT, DPT [phone]   11/16/2014, 1:44 PM

## 2014-11-16 NOTE — Progress Notes (Signed)
[facility] Consult Note   [name] [name] UJW:[id number] DOB: 12/11/1933 DOA: 11/14/2014 PCP: Burnis MedinFULBRIGHT, VIRGINIA E, PA-C  Brief narrative: [name] JabsRonald L [name] is a 81 y.o. male with past medical history of diabetes mellitus, diastolic heart failure from echocardiogram in 2011 and stage III chronic kidney disease admitted on 2/16 for elective left rotator cuff tear repair. The hospitalist services consult to further evaluate his elevated blood pressure.   Subjective: Shuffling gait with PT Will need SNF placement  Assessment/Plan: Principal Problem:   Left rotator cuff tear arthropathy -Management per primary service  Shuffling gait -head CT -will need neurology eval as outpatient   Malignant hypertension -increase lisinopril -PRN hydralazine    Chronic diastolic heart failure -Appears to be euvolemic at this time    CKD (chronic kidney disease) stage 3, GFR 30-59 ml/min - At baseline    Diabetes mellitus without complication - He takes glipizide and metformin at home-glipizide has been continued but metformin is on hold -He is also on a sliding scale with meals    Blood clotting tendency - Continue Xarelto at the dosage he takes at home  Code Status: Full code  Antibiotics: Anti-infectives    Start     Dose/Rate Route Frequency Ordered Stop   11/14/14 1215  ceFAZolin (ANCEF) IVPB 1 g/50 mL premix     1 g 100 mL/hr over 30 Minutes Intravenous Every 6 hours 11/14/14 1203 11/15/14 0320   11/14/14 0600  ceFAZolin (ANCEF) IVPB 2 g/50 mL premix     2 g 100 mL/hr over 30 Minutes Intravenous On call to O.R. 11/13/14 1338 11/14/14 0805         Objective: Filed Weights   11/14/14 1900 11/16/14 0500  Weight: 91.536 kg (201 lb 12.8 oz) 95.766 kg (211 lb 2 oz)    Intake/Output Summary (Last 24 hours) at 11/16/14 1223 Last data filed at 11/15/14 1900  Gross per 24 hour  Intake    360 ml  Output    200 ml  Net    160 ml     Vitals Filed Vitals:   11/15/14 1400  11/15/14 2136 11/16/14 0500 11/16/14 0531  BP: 132/75 157/76  190/87  Pulse: 70 67  96  Temp: 97.4 F (36.3 C) 97.7 F (36.5 C)  98.8 F (37.1 C)  TempSrc:  Oral  Oral  Resp: 18 18  21   Height:      Weight:   95.766 kg (211 lb 2 oz)   SpO2: 96% 95%  96%    Exam: General: pleasant/cooperative Lungs: Clear to auscultation bilaterally without wheezes or crackles Cardiovascular: Regular rate and rhythm without murmur gallop or rub normal S1 and S2 Abdomen: Nontender, nondistended, soft, bowel sounds positive, no rebound, no ascites, no appreciable mass Extremities: No significant cyanosis, clubbing, or edema bilateral lower extremities- left arm in sling  Data Reviewed: Basic Metabolic Panel:  Recent Labs Lab 11/15/14 0604  NA 135  K 4.5  CL 101  CO2 30  GLUCOSE 189*  BUN 23  CREATININE 1.33  CALCIUM 9.0   Liver Function Tests: No results for input(s): AST, ALT, ALKPHOS, BILITOT, PROT, ALBUMIN in the last 168 hours. No results for input(s): LIPASE, AMYLASE in the last 168 hours. No results for input(s): AMMONIA in the last 168 hours. CBC:  Recent Labs Lab 11/15/14 0604  WBC 10.9*  HGB 11.4*  HCT 36.2*  MCV 94.5  PLT 211   Cardiac Enzymes: No results for input(s): CKTOTAL, CKMB, CKMBINDEX, TROPONINI in  the last 168 hours. BNP (last 3 results)  Recent Labs  11/14/14 2112  BNP 152.2*    ProBNP (last 3 results) No results for input(s): PROBNP in the last 8760 hours.  CBG:  Recent Labs Lab 11/15/14 0631 11/15/14 1126 11/15/14 1611 11/15/14 2133 11/16/14 0656  GLUCAP 187* 224* 164* 117* 172*    No results found for this or any previous visit (from the past 240 hour(s)).     Scheduled Meds:  Scheduled Meds: . citalopram  20 mg Oral Daily  . docusate sodium  100 mg Oral BID  . glipiZIDE  5 mg Oral QAC breakfast  . insulin aspart  0-15 Units Subcutaneous TID WC  . [START ON 11/17/2014] lisinopril  10 mg Oral Daily  . metoprolol  50 mg Oral BID   . rivaroxaban  20 mg Oral Q supper  . senna  1 tablet Oral BID   Continuous Infusions:    Time spent on care of this patient: 25 minutes [name],  11/16/2014, 12:23 PM  LOS: 2 days   [facility] Office  [phone] Pager - Text Page per [email or web address]  If 7PM-7AM, please contact night-coverage [email or web address]

## 2014-11-16 NOTE — Progress Notes (Signed)
     Subjective:  Patient reports pain as moderate.  Overall feels much better than yesterday, but hasn't been able to eat much, minimal flatus, no bm.  Doesn't feel ready to go home yet.  Objective:   VITALS:   Filed Vitals:   11/15/14 1400 11/15/14 2136 11/16/14 0500 11/16/14 0531  BP: 132/75 157/76  190/87  Pulse: 70 67  96  Temp: 97.4 F (36.3 C) 97.7 F (36.5 C)  98.8 F (37.1 C)  TempSrc:  Oral  Oral  Resp: 18 18  21   Height:      Weight:   95.766 kg (211 lb 2 oz)   SpO2: 96% 95%  96%    Neurologically intact Dorsiflexion/Plantar flexion intact Incision: dressing C/D/I and no drainage Abdomen moderately distended, no rebound or guarding, still soft.  Lab Results  Component Value Date   WBC 10.9* 11/15/2014   HGB 11.4* 11/15/2014   HCT 36.2* 11/15/2014   MCV 94.5 11/15/2014   PLT 211 11/15/2014   BMET    Component Value Date/Time   NA 135 11/15/2014 0604   K 4.5 11/15/2014 0604   CL 101 11/15/2014 0604   CO2 30 11/15/2014 0604   GLUCOSE 189* 11/15/2014 0604   BUN 23 11/15/2014 0604   CREATININE 1.33 11/15/2014 0604   CALCIUM 9.0 11/15/2014 0604   GFRNONAA 50* 11/15/2014 0604   GFRAA 57* 11/15/2014 0604     Assessment/Plan: 2 Days Post-Op   Principal Problem:   Left rotator cuff tear arthropathy Active Problems:   Diabetes mellitus without complication   Blood clotting tendency   Malignant hypertension   Chronic diastolic heart failure   CKD (chronic kidney disease) stage 3, GFR 30-59 ml/min   Advance diet Up with therapy Plan for discharge tomorrow  Abdomen slightly distended worrisome for early ileus, will need to work with nursing today to get bm    Neizan Debruhl P 11/16/2014, 6:04 AM   Teryl LucyJoshua Harmonie Verrastro, MD Cell [phone]

## 2014-11-16 NOTE — Progress Notes (Signed)
Physical Therapy Treatment Patient Details Name: [name] [name] MRN: [id number] DOB: 03/02/1934 Today's Date: 11/16/2014    History of Present Illness 81 y.o. male [facility] for elective left reverse total shoulder arthroplasty.  Pt with significant PMHx of blood clotting tendancy, DM, Bil RTC surgery, and bil TKA.     PT Comments    Pt's gait is better this PM than during session with OT this AM, however, he continues to demonstrate a very rigid, shuffling, Parkinsonian-type gait pattern that has been exacerbated by this surgery, but seems like it was present to a lesser degree PTA.  He may benefit from neurology consult either inpatient or outpatient to evaluate for neurological cause of his balance and gait deficits.  Pt continues to be appropriate for SNF placement at discharge and PT will continue to follow acutely.   Follow Up Recommendations  SNF     Equipment Recommendations  None recommended by PT    Recommendations for Other Services   NA     Precautions / Restrictions Precautions Precautions: Shoulder;Fall Type of Shoulder Precautions: no shoulder ROM. elbow/wrist/hand only. sling at all times Shoulder Interventions: Shoulder sling/immobilizer;Off for dressing/bathing/exercises;At all times Restrictions LUE Weight Bearing: Non weight bearing    Mobility  Bed Mobility Overal bed mobility: Needs Assistance Bed Mobility: Supine to Sit     Supine to sit: +2 for physical assistance;Mod assist;HOB elevated     General bed mobility comments: Two person mod assist to get to sitting on EOB.  Verbal cues for LE sequencing and right upper extremity placement on railing.  Assist needed to support trunk to get to sitting and weight shift hips to get to EOB. Continued tendancy towards posterior LOB  while attempting to scoot.   Transfers Overall transfer level: Needs assistance Equipment used: Straight cane Transfers: Sit to/from Stand Sit to Stand: +2  physical assistance;Mod assist         General transfer comment: Two person mod assist to stand from bed with cane.  Assist needed at trunk to anteriorly weight shift as he does thrust posteriorly immediately upon standing.  Pt relying on lower legs supported on bed for balance and stability.   Ambulation/Gait Ambulation/Gait assistance: +2 safety/equipment;Mod assist Ambulation Distance (Feet): 45 Feet Assistive device: Straight cane Gait Pattern/deviations: Step-through pattern;Shuffle;Staggering right;Staggering left;Narrow base of support (rigid, shuffling gait) Gait velocity: decreased Gait velocity interpretation: Below normal speed for age/gender General Gait Details: Pt continues to have significant gait instability.  Mod assist to support trunk for balance and assist pt in sequencing the cane during gait.  Pt has a short, choppy, shuffling gait (very rigid).  He is also quick to move and at times trunk gets ahead of his BOS either going forward or while backing up.            Balance Overall balance assessment: Needs assistance Sitting-balance support: Feet supported;Single extremity supported Sitting balance-Leahy Scale: Fair Sitting balance - Comments: Once situated EOB pt was able to maintain upright posture with right upper extremity on bed rail and no outside PT assist.  Postural control: Posterior lean;Right lateral lean Standing balance support: Single extremity supported Standing balance-Leahy Scale: Poor                      Cognition Arousal/Alertness: Awake/alert Behavior During Therapy: WFL for tasks assessed/performed Overall Cognitive Status: Impaired/Different from baseline Area of Impairment: Safety/judgement;Awareness;Problem solving     Memory: Decreased short-term memory;Decreased recall of precautions  Safety/Judgement: Decreased awareness of safety;Decreased awareness of deficits Awareness: Intellectual Problem Solving: Slow  processing;Decreased initiation;Difficulty sequencing General Comments: Pt having a hard time relating that he had gait instability PTA (wife reports the instability)           Pertinent Vitals/Pain Pain Assessment: 0-10 Pain Score: 4  Pain Location: left shoulder when moving Pain Descriptors / Indicators: Aching;Burning Pain Intervention(s): Limited activity within patient's tolerance;Monitored during session;Repositioned           PT Goals (current goals can now be found in the care plan section) Acute Rehab PT Goals Patient Stated Goal: to get better and back to normal.  Progress towards PT goals: Progressing toward goals    Frequency  Min 3X/week (confirmed SNF placement)    PT Plan Frequency needs to be updated       End of Session Equipment Utilized During Treatment: Gait belt;Other (comment) (left arm sling) Activity Tolerance: Patient tolerated treatment well Patient left: in chair;with call bell/phone within reach;with family/visitor present     Time: 1610-9604 PT Time Calculation (min) (ACUTE ONLY): 22 min  Charges:  $Gait Training: 8-22 mins                      [name] [name] B. [name] [name], PT, DPT [phone]   11/16/2014, 3:46 PM

## 2014-11-16 NOTE — Clinical Social Work Placement (Signed)
Clinical Social Work Department CLINICAL SOCIAL WORK PLACEMENT NOTE 11/16/2014  Patient:  Claybon JabsRICE,Abriel L  Account Number:  [id number] Admit date:  11/14/2014  Clinical Social Worker:  Read DriversEGINA Ander Wamser, LCSWA  Date/time:  11/16/2014 12:03 PM  Clinical Social Work is seeking post-discharge placement for this patient at the following level of care:   SKILLED NURSING   (*CSW will update this form in Epic as items are completed)   11/16/2014  Patient/family provided with [facility] Department of Clinical Social Work's list of facilities offering this level of care within the geographic area requested by the patient (or if unable, by the patient's family).  11/16/2014  Patient/family informed of their freedom to choose among providers that offer the needed level of care, that participate in Medicare, Medicaid or managed care program needed by the patient, have an available bed and are willing to accept the patient.  11/16/2014  Patient/family informed of [facility]' ownership interest in [facility], as well as of the fact that they are under no obligation to receive care at this facility.  PASARR submitted to EDS on  PASARR number received on   FL2 transmitted to all facilities in geographic area requested by pt/family on  11/16/2014 FL2 transmitted to all facilities within larger geographic area on   Patient informed that his/her managed care company has contracts with or will negotiate with  certain facilities, including the following:     Patient/family informed of bed offers received:  11/16/2014 [facility]ician [facility] and [facility]  [facility]   Patient to be transferred to facility by PTAR Patient and family notified of transfer on  Name of family member notified:  Deberah PeltonJudii and Kelli  The following physician request were entered in Epic:   Additional Comments: PASARR  existing   Vickii PennaGina Favor Hackler, LCSWA [phone]  Psychiatric & Orthopedics (5N 1-16) Clinical Social Worker

## 2014-11-16 NOTE — Clinical Social Work Psychosocial (Signed)
Clinical Social Work Department BRIEF PSYCHOSOCIAL ASSESSMENT 11/16/2014  Patient:  [name] [name],[name] [name]     Account Number:  [id number]     Admit date:  11/14/2014  Clinical Social Worker:  Read DriversINGLE,Niomi Valent, LCSWA  Date/Time:  11/16/2014 11:31 AM  Referred by:  Physician  Date Referred:  11/16/2014 Referred for  SNF Placement  Psychosocial assessment   Other Referral:   none   Interview type:  Other - See comment Other interview type:   patient, patient's wife and personal "helper"    PSYCHOSOCIAL DATA Living Status:  WIFE Admitted from facility:   Level of care:   Primary support name:  [name] [name] Primary support relationship to patient:  SPOUSE Degree of support available:   strong    CURRENT CONCERNS Current Concerns  Post-Acute Placement   Other Concerns:   none    SOCIAL WORK ASSESSMENT / PLAN CSW assessed patient at bedside. Patient is alert and oriented x4.  Patient has supportive wife, [name] [name] and "personal helper" at bedside.  Patient reports being from home with wife prior to this hospitalization.  Patient reports having a "personal trainer" at the gym he attends that works with him 3 times per week.  PT is recommending SNF at time of dc.  Patient [place] agreeable [facility]  [facility] in [facility] past.  Patient and family are requesting [facility] at time of dc.  [facility] is able to offer a bed (semi-private).  Family is agreeable. CSW relayed [name] [name]'s contact information to [facility] (per [name] [name]'s request) as [name] [name] will assist with the completion of the admission paperwork.   Assessment/plan status:  Psychosocial Support/Ongoing Assessment of Needs Other assessment/ plan:   FL2  PASARR- existing   Information/referral to community resources:   SNF/STR    PATIENT'S/FAMILY'S RESPONSE TO PLAN OF CARE: Patient and family is agreeable to [place] [facility].  Patient first choice- [facility].       Vickii PennaGina  Jomarion Mish, LCSWA [phone]  Psychiatric & Orthopedics (5N 1-16) Clinical Social Worker

## 2014-11-16 NOTE — Progress Notes (Signed)
Occupational Therapy Treatment Patient Details Name: [name] [name] MRN: [id number] DOB: 04/03/1934 Today's Date: 11/16/2014    History of present illness 81 y.o. male admitted to [facility] on 11/14/14 for elective left reverse total shoulder arthroplasty.  Pt with significant PMHx of blood clotting tendancy, DM, Bil RTC surgery, and bil TKA.    OT comments  Attempted to ambulate pt around bed to chair- unable due to needing +2 for mobility. Pt demonstrates difficulty with initiation of movement; shuffling steps to chair and is unable to maintain upright postural control in sitting or standing. Discussed concerns regarding mobility with wife, who states that her husband usually has to stand "@ 30 seconds before he starts moving" and that he "shuffles" when he walks. Wife states that he has had a harder time "moving" around and that it seems to be getting worse. Wife thought it was due to "his shoulder". Discussed concerns with Dr. Benjamine Mola. Will continue to follow acutely. Pt needs SNF fro rehab.   Follow Up Recommendations  SNF;Supervision/Assistance - 24 hour    Equipment Recommendations  Other (comment) (TBD at SNF)    Recommendations for Other Services      Precautions / Restrictions Precautions Precautions: Shoulder;Fall Type of Shoulder Precautions: no shoulder ROM. elbow/wrist/hand only. sling at all times Shoulder Interventions: Shoulder sling/immobilizer;Off for dressing/bathing/exercises;At all times Precaution Booklet Issued: Yes (comment) Precaution Comments: Pt again today unable to recall any instructions regarding care of his LUE Restrictions Weight Bearing Restrictions: Yes LUE Weight Bearing: Non weight bearing       Mobility Bed Mobility Overal bed mobility: Needs Assistance Bed Mobility: Supine to Sit     Supine to sit: Max assist;HOB elevated        Transfers Overall transfer level: Needs assistance Equipment used: 1 person hand held assist Transfers: Sit to/from  [employer] Sit to Stand: Mod assist Stand pivot transfers: Max assist       General transfer comment: recommend +2 for safety    Balance Overall balance assessment: Needs assistance   Sitting balance-Leahy Scale: Poor Sitting balance - Comments: Leaning R. unable to achieve midline postural control; able to move to midline with vc but unable to maintain Postural control: Posterior lean;Right lateral lean Standing balance support: Single extremity supported;During functional activity Standing balance-Leahy Scale: Zero                     ADL Overall ADL's : Needs assistance/impaired                 Upper Body Dressing : Maximal assistance Upper Body Dressing Details (indicate cue type and reason): Unable to recall any compensatory techniques from yesterday's session     Toilet Transfer: Maximal assistance;Stand-pivot           Functional mobility during ADLs: Maximal assistance (+2 for any ambultion) General ADL Comments: significant posterior and R lateral lean. unable to achieve full upright posture for ADL. Shuffling steps to chair. Trying to sit prior to getting to chair requiring max A to get to chair.                                      Cognition   Behavior During Therapy: WFL for tasks assessed/performed Overall Cognitive Status: Impaired/Different from baseline Area of Impairment: Safety/judgement;Awareness;Problem solving     Memory: Decreased short-term memory    Safety/Judgement: Decreased awareness of safety;Decreased awareness  of deficits Awareness: Intellectual Problem Solving: Slow processing;Decreased initiation;Difficulty sequencing General Comments: Decreased awareness of poor postural control.   Pt states his "mind feels better today"    Extremity/Trunk Assessment               Exercises Donning/doffing shirt without moving shoulder: Maximal assistance Method for sponge bathing under operated  UE: Maximal assistance Donning/doffing sling/immobilizer: Maximal assistance Correct positioning of sling/immobilizer: Maximal assistance ROM for elbow, wrist and digits of operated UE: Moderate assistance Sling wearing schedule (on at all times/off for ADL's): Moderate assistance Proper positioning of operated UE when showering: Moderate assistance Positioning of UE while sleeping: Moderate assistance   Shoulder Instructions Shoulder Instructions Donning/doffing shirt without moving shoulder: Maximal assistance Method for sponge bathing under operated UE: Maximal assistance Donning/doffing sling/immobilizer: Maximal assistance Correct positioning of sling/immobilizer: Maximal assistance ROM for elbow, wrist and digits of operated UE: Moderate assistance Sling wearing schedule (on at all times/off for ADL's): Moderate assistance Proper positioning of operated UE when showering: Moderate assistance Positioning of UE while sleeping: Moderate assistance See doc flow for exercise - did not populate Elbow flex/ext 10x sitting Elbow pronation/supination 10x sitting Also educated wife in L elbow exercise and aske for her to complete with pt 2 more times today. Wife return demonstrated understanding.    General Comments  Concerns over "neuro presentation" regarding mobility deficits - discussed with MD    Pertinent Vitals/ Pain       Pain Assessment: 0-10 Pain Score: 5  Pain Location: L shoulder Pain Descriptors / Indicators: Aching Pain Intervention(s): Limited activity within patient's tolerance;Monitored during session;Repositioned  Home Living                                          Prior Functioning/Environment              Frequency Min 3X/week     Progress Toward Goals  OT Goals(current goals can now be found in the care plan section)  Progress towards OT goals: Progressing toward goals  Acute Rehab OT Goals Patient Stated Goal: to get better and  back to normal.  OT Goal Formulation: With patient Time For Goal Achievement: 11/29/14 Potential to Achieve Goals: Good ADL Goals Pt Will Perform Upper Body Bathing: with min assist;sitting Pt Will Perform Upper Body Dressing: with min assist;sitting Pt Will Transfer to Toilet: ambulating;regular height toilet;grab bars;with min assist Pt Will Perform Toileting - Clothing Manipulation and hygiene: with min guard assist;sit to/from stand Pt/caregiver will Perform Home Exercise Program: Left upper extremity;With written HEP provided Additional ADL Goal #1: pt/wife independent with proper positioning LUE in sitting/supine and sling management  Plan Discharge plan remains appropriate    Co-evaluation                 End of Session Equipment Utilized During Treatment: Gait belt   Activity Tolerance Patient tolerated treatment well   Patient Left in chair;with call bell/phone within reach   Nurse Communication Mobility status;Precautions;Weight bearing status        Time: 1025-1103 OT Time Calculation (min): 38 min  Charges: OT General Charges $OT Visit: 1 Procedure OT Treatments $Self Care/Home Management : 8-22 mins $Therapeutic Activity: 8-22 mins $Therapeutic Exercise: 8-22 mins  Marie Chow,HILLARY 11/16/2014, 11:25 AM   Luisa DagoHilary Masud Holub, OTR/L  [phone] 11/16/2014

## 2014-11-16 NOTE — Plan of Care (Signed)
Problem: Consults Goal: Diagnosis - Shoulder Surgery Outcome: Completed/Met Date Met:  11/16/14 Rotator Cuff Left

## 2014-11-16 NOTE — Clinical Social Work Note (Addendum)
CSW met with family (wife Tito Dine and "helper" Claiborne Billings) to review bed offers. Family chooses [facility].  [facility] has confirmed bed availability and acceptance.  Patient reports needing [facility]AR transportation at time of discharge.  Nonnie Done, [place] [phone]  Psychiatric & Orthopedics (5N 1-16) Clinical Social Worker

## 2014-11-17 LAB — GLUCOSE, CAPILLARY
GLUCOSE-CAPILLARY: 183 mg/dL — AB (ref 70–99)
Glucose-Capillary: 145 mg/dL — ABNORMAL HIGH (ref 70–99)

## 2014-11-17 MED ORDER — BISACODYL 10 MG RE SUPP
10.0000 mg | Freq: Once | RECTAL | Status: AC
  Administered 2014-11-17: 10 mg via RECTAL
  Filled 2014-11-17: qty 1

## 2014-11-17 MED ORDER — BISACODYL 10 MG RE SUPP: 10.0000 mg | Freq: Every day | RECTAL | Status: DC | PRN

## 2014-11-17 MED ORDER — DOCUSATE SODIUM 100 MG PO CAPS: 100.0000 mg | ORAL_CAPSULE | Freq: Two times a day (BID) | ORAL | Status: DC

## 2014-11-17 NOTE — Progress Notes (Signed)
PT Cancellation Note  Patient Details Name: [name] [name] MRN: [id number] DOB: 01/26/1934   Cancelled Treatment:    Reason Eval/Treat Not Completed: Other (comment).  Pt already in the chair.  Did not want to walk as he was preparing to leave and wanted to conserve his energy at this time.  [facility] in room doing intake paperwork with wife.   Thanks,    Rollene Rotundaebecca B. Aishwarya Shiplett, PT, DPT [phone]   11/17/2014, 11:57 AM

## 2014-11-17 NOTE — Progress Notes (Signed)
Occupational Therapy Treatment Patient Details Name: [name] [name] MRN: [id number] DOB: 05/10/1934 Today's Date: 11/17/2014    History of present illness 81 [facility].o. [facility] for elective left reverse total shoulder arthroplasty.  Pt with significant PMHx of blood clotting tendancy, DM, Bil RTC surgery, and bil TKA.    OT comments  Reviewed allowable movement with LUE again with pt/wife and instructed to give shoulder protocol D/C sheet [facility]. Improved ROM L elbow with decreased c/o pain. Further OT to be addressed at SNF.  Follow Up Recommendations  SNF;Supervision/Assistance - 24 hour    Equipment Recommendations  Other (comment)    Recommendations for Other Services      Precautions / Restrictions Precautions Precautions: Shoulder;Fall Type of Shoulder Precautions: no shoulder ROM. elbow/wrist/hand only. sling at all times Shoulder Interventions: Shoulder sling/immobilizer;Off for dressing/bathing/exercises;At all times Precaution Booklet Issued: Yes (comment) Restrictions LUE Weight Bearing: Non weight bearing              ADL                                         General ADL Comments: REviewed precautions with wife/pt. Instructed pt/wife to give shoulder protocol sheet [facility]                                      Cognition   Behavior During Therapy: [facility] for tasks assessed/performed Overall Cognitive Status: Impaired/Different from baseline                       Extremity/Trunk Assessment   LUE edematous. Reviewed importance of elbow ROM at least 3x/day to increase ROM and decrease edema. Reviewed importance of pt not pushing up through LUE during transfers.            Exercises Shoulder Exercises Elbow Flexion: Left;10 reps;Seated;AAROM Elbow Extension: AAROM;Left;10 reps;Seated Wrist Flexion: AROM;Left;10 reps Wrist Extension: AROM;Left;10 reps Digit  Composite Flexion: Left;AROM Composite Extension: Left;AROM Correct positioning of sling/immobilizer: Minimal assistance ROM for elbow, wrist and digits of operated UE: Minimal assistance Sling wearing schedule (on at all times/off for ADL's): Minimal assistance Positioning of UE while sleeping: Supervision/safety   Shoulder Instructions Shoulder Instructions Correct positioning of sling/immobilizer: Minimal assistance ROM for elbow, wrist and digits of operated UE: Minimal assistance Sling wearing schedule (on at all times/off for ADL's): Minimal assistance Positioning of UE while sleeping: Supervision/safety     General Comments      Pertinent Vitals/ Pain       Pain Assessment: 0-10 Pain Score: 4  Pain Location: L shoulder Pain Descriptors / Indicators: Aching Pain Intervention(s): Limited activity within patient's tolerance  Home Living                                          Prior Functioning/Environment              Frequency Min 3X/week     Progress Toward Goals  OT Goals(current goals can now be found in the care plan section)  Progress towards OT goals: Progressing toward goals  Acute Rehab OT Goals Patient Stated Goal: to get better and back  to normal.  OT Goal Formulation: With patient Time For Goal Achievement: 11/29/14 Potential to Achieve Goals: Good ADL Goals Pt Will Perform Upper Body Bathing: with min assist;sitting Pt Will Perform Upper Body Dressing: with min assist;sitting Pt Will Transfer to Toilet: ambulating;regular height toilet;grab bars;with min assist Pt Will Perform Toileting - Clothing Manipulation and hygiene: with min guard assist;sit to/from stand Pt/caregiver will Perform Home Exercise Program: Left upper extremity;With written HEP provided Additional ADL Goal #1: pt/wife independent with proper positioning LUE in sitting/supine and sling management  Plan Discharge plan remains appropriate                        Activity Tolerance Patient tolerated treatment well   Patient Left in chair;with call bell/phone within reach;with family/visitor present   Nurse Communication Mobility status;Precautions;Weight bearing status        Time: 12:20-1235 15 min.hil    Charges: OT General Charges $OT Visit: 1 Procedure OT Treatments $Therapeutic Activity: 8-22 mins  Lucio Litsey,HILLARY 11/17/2014, 1:05 PM [facility], OTR/L  [phone] 11/17/2014  Herald Vallin, OTR/L  [phone] 11/17/2014

## 2014-11-17 NOTE — Discharge Summary (Signed)
Physician Discharge Summary  [name] FAO:[id number] DOB: [birth year] DOA: 11/14/2014  PCP: Burnis Medin, PA-C  Admit date: 11/14/2014 Discharge date: 11/17/2014  Time spent: 35 minutes  Recommendations for Outpatient Follow-up:  1. Outpatient referral to neurology re: shuffling gait 2. To SNF as patient is unable to use walker due to should surgery and is on a blood thinner and at risk for falls 3. Bowel regimen for daily BMs- limit pain meds  Discharge Diagnoses:  Principal Problem:   Left rotator cuff tear arthropathy Active Problems:   Diabetes mellitus without complication   Blood clotting tendency   Malignant hypertension   Chronic diastolic heart failure   CKD (chronic kidney disease) stage 3, GFR 30-59 ml/min   Discharge Condition: improved  Diet recommendation: cardiac  Filed Weights   11/14/14 1900 11/16/14 0500  Weight: 91.536 kg (201 lb 12.8 oz) 95.766 kg (211 lb 2 oz)    History of present illness:  [name] [name] is a 81 y.o. male who presents for preoperative history and physical with a diagnosis of left shoulder rotator cuff arthropathy. Symptoms are rated as moderate to severe, and have been worsening. This is significantly impairing activities of daily living. He has elected for surgical management. He has failed injections, activity modification, anti-inflammatories, and assistive devices.  Preoperative X-rays demonstrate end stage degenerative changes with osteophyte formation, loss of joint space, subchondral sclerosis. He has a high riding humerus with chronic superior migration.  Hospital Course:  Left rotator cuff tear arthropathy -Management per ortho  Shuffling gait but no tremor like parkinsons -head CT ok -will need neurology eval as outpatient   Malignant hypertension -improved   Chronic diastolic heart failure -Appears to be euvolemic at this time   CKD (chronic kidney disease) stage 3, GFR 30-59 ml/min - At baseline    Diabetes mellitus without complication - He takes glipizide and metformin at home-glipizide has been continued but metformin is on hold -He is also on a sliding scale with meals   Blood clotting tendency - Continue Xarelto at the dosage he takes at home  Procedures:    Consultations:  ortho  Discharge Exam: Filed Vitals:   11/17/14 0600  BP: 163/84  Pulse: 78  Temp: 98.2 F (36.8 C)  Resp: 18    General: pleasant/cooeprative Cardiovascular: rrr Respiratory: clear  Discharge Instructions   Discharge Instructions    Diet - low sodium heart healthy    Complete by:  As directed      Diet Carb Modified    Complete by:  As directed      Increase activity slowly    Complete by:  As directed           Current Discharge Medication List    START taking these medications   Details  baclofen (LIORESAL) 10 MG tablet Take 1 tablet (10 mg total) by mouth 3 (three) times daily. As needed for muscle spasm Qty: 50 tablet, Refills: 0    bisacodyl (DULCOLAX) 10 MG suppository Place 1 suppository (10 mg total) rectally daily as needed for moderate constipation. Qty: 12 suppository, Refills: 0    docusate sodium (COLACE) 100 MG capsule Take 1 capsule (100 mg total) by mouth 2 (two) times daily. Qty: 10 capsule, Refills: 0    HYDROcodone-acetaminophen (NORCO) 10-325 MG per tablet Take 1-2 tablets by mouth every 6 (six) hours as needed. Qty: 75 tablet, Refills: 0    ondansetron (ZOFRAN) 4 MG tablet Take 1 tablet (4 mg total) by  mouth every 8 (eight) hours as needed for nausea or vomiting. Qty: 30 tablet, Refills: 0    sennosides-docusate sodium (SENOKOT-S) 8.6-50 MG tablet Take 2 tablets by mouth daily. Qty: 30 tablet, Refills: 1      CONTINUE these medications which have NOT CHANGED   Details  citalopram (CELEXA) 20 MG tablet Take 20 mg by mouth daily.     glipiZIDE (GLUCOTROL) 5 MG tablet Take 5 mg by mouth daily before breakfast.    lisinopril (PRINIVIL,ZESTRIL) 5  MG tablet Take 5 mg by mouth daily.     metFORMIN (GLUMETZA) 500 MG (MOD) 24 hr tablet Take 2,000 mg by mouth at bedtime.     metoprolol (LOPRESSOR) 100 MG tablet Take 50 mg by mouth 2 (two) times daily.     rivaroxaban (XARELTO) 20 MG TABS tablet Take 20 mg by mouth daily with supper.       STOP taking these medications     acetaminophen (TYLENOL) 650 MG CR tablet        Allergies  Allergen Reactions  . Other Other (See Comments)    Blood Products-Jehovas Witness Unknown Pain Medication caused hallucinations   Follow-up Information    Follow up with Eulas Post, MD. Schedule an appointment as soon as possible for a visit in 2 weeks.   Specialty:  Orthopedic Surgery   Contact information:   [address] [place] [address] [phone]        The results of significant diagnostics from this hospitalization (including imaging, microbiology, ancillary and laboratory) are listed below for reference.    Significant Diagnostic Studies: Ct Head Wo Contrast  11/16/2014   CLINICAL DATA:  Shuffling gait. Initial encounter. Stroke risk factors include diabetes.  EXAM: CT HEAD WITHOUT CONTRAST  TECHNIQUE: Contiguous axial images were obtained from the base of the skull through the vertex without intravenous contrast.  COMPARISON:  MR brain 09/21/2010.  FINDINGS: No evidence for acute infarction, hemorrhage, mass lesion, hydrocephalus, or extra-axial fluid. Asymmetric focus of hypoattenuation LEFT posterior limb internal capsule/posterior lentiform nucleus region as seen on image 14 series 2. corresponds to a large perivascular space as seen on prior MR.  Mild hypoattenuation of white matter likely small vessel disease. No large vessel or lacunar infarct.  Calvarium intact.  No sinus or mastoid disease.  Negative orbits.  Compared with prior MR, similar appearance is noted.  IMPRESSION: Chronic changes as described.  No acute intracranial findings.   Electronically  Signed   By: Davonna Belling M.D.   On: 11/16/2014 14:58   Dg Shoulder Left  11/14/2014   CLINICAL DATA:  Total shoulder arthroplasty, postoperative exam  EXAM: LEFT SHOULDER - 2+ VIEW  COMPARISON:  Left shoulder CT 07/14/2014  FINDINGS: A single projection demonstrates left shoulder arthroplasty. No evidence for hardware failure. Soft tissue gas is noted. A focal radiopacity projecting over the soft tissues adjacent to the humeral neck is identified, of unclear clinical significance. No fracture line is seen. Small left pleural effusion is noted.  IMPRESSION: Left total shoulder arthroplasty. Focal radiopacity projecting over the soft tissues of the level of the left humeral neck, of unclear clinical significance.   Electronically Signed   By: Christiana Pellant M.D.   On: 11/14/2014 11:14    Microbiology: No results found for this or any previous visit (from the past 240 hour(s)).   Labs: Basic Metabolic Panel:  Recent Labs Lab 11/15/14 0604  NA 135  K 4.5  CL 101  CO2  30  GLUCOSE 189*  BUN 23  CREATININE 1.33  CALCIUM 9.0   Liver Function Tests: No results for input(s): AST, ALT, ALKPHOS, BILITOT, PROT, ALBUMIN in the last 168 hours. No results for input(s): LIPASE, AMYLASE in the last 168 hours. No results for input(s): AMMONIA in the last 168 hours. CBC:  Recent Labs Lab 11/15/14 0604  WBC 10.9*  HGB 11.4*  HCT 36.2*  MCV 94.5  PLT 211   Cardiac Enzymes: No results for input(s): CKTOTAL, CKMB, CKMBINDEX, TROPONINI in the last 168 hours. BNP: BNP (last 3 results)  Recent Labs  11/14/14 2112  BNP 152.2*    ProBNP (last 3 results) No results for input(s): PROBNP in the last 8760 hours.  CBG:  Recent Labs Lab 11/15/14 2133 11/16/14 0656 11/16/14 1737 11/16/14 2158 11/17/14 0628  GLUCAP 117* 172* 222* 109* 145*       Signed:  Davin Archuletta  [facility] 11/17/2014, 9:36 AM

## 2014-11-22 ENCOUNTER — Non-Acute Institutional Stay (SKILLED_NURSING_FACILITY): Payer: Medicare Other | Admitting: Internal Medicine

## 2014-11-22 DIAGNOSIS — I10 Essential (primary) hypertension: Secondary | ICD-10-CM

## 2014-11-22 DIAGNOSIS — F329 Major depressive disorder, single episode, unspecified: Secondary | ICD-10-CM

## 2014-11-22 DIAGNOSIS — K5900 Constipation, unspecified: Secondary | ICD-10-CM | POA: Diagnosis not present

## 2014-11-22 DIAGNOSIS — I5032 Chronic diastolic (congestive) heart failure: Secondary | ICD-10-CM

## 2014-11-22 DIAGNOSIS — E119 Type 2 diabetes mellitus without complications: Secondary | ICD-10-CM

## 2014-11-22 DIAGNOSIS — M75102 Unspecified rotator cuff tear or rupture of left shoulder, not specified as traumatic: Principal | ICD-10-CM

## 2014-11-22 DIAGNOSIS — M12512 Traumatic arthropathy, left shoulder: Secondary | ICD-10-CM | POA: Diagnosis not present

## 2014-11-22 DIAGNOSIS — M12812 Other specific arthropathies, not elsewhere classified, left shoulder: Secondary | ICD-10-CM

## 2014-11-22 DIAGNOSIS — R2689 Other abnormalities of gait and mobility: Secondary | ICD-10-CM | POA: Diagnosis not present

## 2014-11-22 DIAGNOSIS — F32A Depression, unspecified: Secondary | ICD-10-CM

## 2014-12-24 NOTE — Progress Notes (Signed)
Patient ID: [name] [name], male   DOB: 09/21/1934, 81 y.o.   MRN: [id number]     [facility]   PCP: FULBRIGHT, VIRGINIA E, PA-C  Code Status: full code  Allergies  Allergen Reactions  . Other Other (See Comments)    Blood Products-Jehovas Witness Unknown Pain Medication caused hallucinations    Chief Complaint  Patient presents with  . New Admit To SNF     HPI:  81 year old patient is here for short term rehabilitation post hospital admission from 11/14/14-11/17/14 with left rotator cuff tear arthropathy. He underwent surgical repair. He is seen in his room today. He denies any concerns. He has PMH of DM, HTN, chronic diastolic heart failure, ckd stage 3 and blood clotting tendency.  Review of Systems:  Constitutional: Negative for fever, chills, diaphoresis.  HENT: Negative for headache, congestion Eyes: Negative for eye pain, blurred vision, double vision and discharge.  Respiratory: Negative for cough, shortness of breath and wheezing.   Cardiovascular: Negative for chest pain, palpitations, leg swelling.  Gastrointestinal: Negative for heartburn, nausea, vomiting, abdominal pain. Bowel movement this am Genitourinary: Negative for dysuria and flank pain.  Musculoskeletal: Negative for back pain, falls Skin: Negative for itching, rash.  Neurological: Negative for weakness Psychiatric/Behavioral: Negative for depression   Past Medical History  Diagnosis Date  . Blood clotting tendency     right leg went to lung  . Diabetes mellitus without complication   . History of kidney stones   . Arthritis   . Left rotator cuff tear arthropathy 11/14/2014   Past Surgical History  Procedure Laterality Date  . Total knee arthroplasty      bilat  . Rotator cuff repair      bilat  . Cataract extraction w/ intraocular lens  implant, bilateral    . Reverse shoulder arthroplasty Left 11/14/2014    Procedure: REVERSE SHOULDER ARTHROPLASTY;  Surgeon:  Eulas PostJoshua P Landau, MD;  Location: [facility] OR;  Service: Orthopedics;  Laterality: Left;   Social History:   reports that he has never smoked. He does not have any smokeless tobacco history on file. He reports that he does not drink alcohol or use illicit drugs.  No family history on file.  Medications: Patient's Medications  New Prescriptions   No medications on file  Previous Medications   BACLOFEN (LIORESAL) 10 MG TABLET    Take 1 tablet (10 mg total) by mouth 3 (three) times daily. As needed for muscle spasm   BISACODYL (DULCOLAX) 10 MG SUPPOSITORY    Place 1 suppository (10 mg total) rectally daily as needed for moderate constipation.   CITALOPRAM (CELEXA) 20 MG TABLET    Take 20 mg by mouth daily.    DOCUSATE SODIUM (COLACE) 100 MG CAPSULE    Take 1 capsule (100 mg total) by mouth 2 (two) times daily.   GLIPIZIDE (GLUCOTROL) 5 MG TABLET    Take 5 mg by mouth daily before breakfast.   HYDROCODONE-ACETAMINOPHEN (NORCO) 10-325 MG PER TABLET    Take 1-2 tablets by mouth every 6 (six) hours as needed.   LISINOPRIL (PRINIVIL,ZESTRIL) 5 MG TABLET    Take 5 mg by mouth daily.    METFORMIN (GLUMETZA) 500 MG (MOD) 24 HR TABLET    Take 2,000 mg by mouth at bedtime.    METOPROLOL (LOPRESSOR) 100 MG TABLET    Take 50 mg by mouth 2 (two) times daily.    ONDANSETRON (ZOFRAN) 4 MG TABLET    Take  1 tablet (4 mg total) by mouth every 8 (eight) hours as needed for nausea or vomiting.   RIVAROXABAN (XARELTO) 20 MG TABS TABLET    Take 20 mg by mouth daily with supper.    SENNOSIDES-DOCUSATE SODIUM (SENOKOT-S) 8.6-50 MG TABLET    Take 2 tablets by mouth daily.  Modified Medications   No medications on file  Discontinued Medications   No medications on file     Physical Exam: Filed Vitals:   11/22/14 1541  BP: 116/64  Pulse: 64  Temp: 97.9 F (36.6 C)  Resp: 19  SpO2: 97%    General- elderly male, in no acute distress Head- normocephalic, atraumatic Throat- moist mucus membrane Neck- no cervical  lymphadenopathy Cardiovascular- normal s1,s2, no murmurs, palpable dorsalis pedis and radial pulses, trace leg edema Respiratory- bilateral clear to auscultation, no wheeze, no rhonchi, no crackles, no use of accessory muscles Abdomen- bowel sounds present, soft, non tender Musculoskeletal- able to move all 4 extremities, limited left shoulder range of motion but able to move his fingers well, ted hose Neurological- no focal deficit Skin- warm and dry, aquacel dressing to left shoulder area, has sling on left side,  Psychiatry- alert and oriented to person, place and time, normal mood and affect    Labs reviewed: Basic Metabolic Panel:  Recent Labs  40/98/11 1522 11/15/14 0604  NA 139 135  K 3.9 4.5  CL 105 101  CO2 27 30  GLUCOSE 145* 189*  BUN 23 23  CREATININE 1.17 1.33  CALCIUM 9.5 9.0   CBC:  Recent Labs  11/03/14 1522 11/15/14 0604  WBC 9.1 10.9*  HGB 13.3 11.4*  HCT 41.2 36.2*  MCV 91.6 94.5  PLT 230 211   CBG:  Recent Labs  11/16/14 2158 11/17/14 0628 11/17/14 1217  GLUCAP 109* 145* 183*     Assessment/Plan  Left shoulder arthropathy From rotator cuff injury. S/p surgical repair. Will have patient work with PT/OT as tolerated to regain strength and restore function.  Fall precautions are in place. Has f/u with orthopedics.continue noroc 10-325 1-2 tab q6h prn for pain and baclofen for muscle spasm. Continue xarelto for dvt prophylaxis  Shuffling gait Will have him work with physical therapy and occupational therapy team to help with gait training and muscle strengthening exercises.fall precautions. Skin care. Encourage to be out of bed. To f/u with neurology as outpatient  Constipation Continue senokot s, dulcolax and colace  HTN Stable, continue lisinopril 5 mg daily and lopressor 50 mg bid  Chronic diastolic CHF Stable, continue b blocker and ACEI.  Depression Stable mood, continue celexa 20 mg daily  DM Monitor cbg, continue glipizide  5 mg daily and metformin 2000 mg daily     Goals of care: short term rehabilitation   Labs/tests ordered: cbc with diff, cmp  Family/ staff Communication: reviewed care plan with patient and nursing supervisor    Oneal Grout, MD  [facility] [phone] (Monday-Friday 8 am - 5 pm) [phone] (afterhours)

## 2014-12-26 ENCOUNTER — Non-Acute Institutional Stay (SKILLED_NURSING_FACILITY): Payer: Medicare Other | Admitting: Adult Health

## 2014-12-26 ENCOUNTER — Encounter: Payer: Self-pay | Admitting: Adult Health

## 2014-12-26 DIAGNOSIS — I5032 Chronic diastolic (congestive) heart failure: Secondary | ICD-10-CM

## 2014-12-26 DIAGNOSIS — R2689 Other abnormalities of gait and mobility: Secondary | ICD-10-CM

## 2014-12-26 DIAGNOSIS — I10 Essential (primary) hypertension: Secondary | ICD-10-CM

## 2014-12-26 DIAGNOSIS — M75102 Unspecified rotator cuff tear or rupture of left shoulder, not specified as traumatic: Principal | ICD-10-CM

## 2014-12-26 DIAGNOSIS — F32A Depression, unspecified: Secondary | ICD-10-CM

## 2014-12-26 DIAGNOSIS — F329 Major depressive disorder, single episode, unspecified: Secondary | ICD-10-CM

## 2014-12-26 DIAGNOSIS — E119 Type 2 diabetes mellitus without complications: Secondary | ICD-10-CM | POA: Diagnosis not present

## 2014-12-26 DIAGNOSIS — M12512 Traumatic arthropathy, left shoulder: Secondary | ICD-10-CM | POA: Diagnosis not present

## 2014-12-26 DIAGNOSIS — K5900 Constipation, unspecified: Secondary | ICD-10-CM

## 2014-12-26 DIAGNOSIS — F419 Anxiety disorder, unspecified: Secondary | ICD-10-CM

## 2014-12-26 DIAGNOSIS — M12812 Other specific arthropathies, not elsewhere classified, left shoulder: Secondary | ICD-10-CM

## 2014-12-26 NOTE — Progress Notes (Signed)
Patient ID: [name] [name], male   DOB: 01/06/1934, 81 y.o.   MRN: [id number]   12/26/2014  Facility:  Nursing Home Location:  [facility] Room Number: 1206-P LEVEL OF CARE:  SNF (31)   Chief Complaint  Patient presents with  . Discharge Note    Left rotator cuff tear arthropathy S/P reverse left total shoulder replacement, chronic diastolic CHF, diabetes mellitus, hypertension, depression, constipation, anxiety and shuffling gait    HISTORY OF PRESENT ILLNESS:  This is a 81 year old male who is for discharge home with home health PT for ambulation and strengthening,  OT for self care and home health aide for bathing/activities of daily living. [facility] with left rotator cuff tear arthropathy S/P reverse left total shoulder replacement.  Patient was admitted to this facility for short-term rehabilitation after the patient's recent hospitalization.  Patient has completed SNF rehabilitation and therapy has cleared the patient for discharge.  PAST MEDICAL HISTORY:  Past Medical History  Diagnosis Date  . Blood clotting tendency     right leg went to lung  . Diabetes mellitus without complication   . History of kidney stones   . Arthritis   . Left rotator cuff tear arthropathy 11/14/2014    CURRENT MEDICATIONS: Reviewed per MAR/see medication list  Allergies  Allergen Reactions  . Other Other (See Comments)    Blood Products-Jehovas Witness Unknown Pain Medication caused hallucinations     REVIEW OF SYSTEMS:  GENERAL: no change in appetite, no fatigue, no weight changes, no fever, chills or weakness RESPIRATORY: no cough, SOB, DOE, wheezing, hemoptysis CARDIAC: no chest pain, edema or palpitations GI: no abdominal pain, diarrhea, constipation, heart burn, nausea or vomiting  PHYSICAL EXAMINATION  GENERAL: no acute distress, normal body habitus SKIN:  Left shoulder surgical incision  is dry, healed EYES: conjunctivae normal, sclerae normal, normal eye lids NECK: supple, trachea midline, no neck masses, no thyroid tenderness, no thyromegaly LYMPHATICS: no LAN in the neck, no supraclavicular LAN RESPIRATORY: breathing is even & unlabored, BS CTAB CARDIAC: RRR, no murmur,no extra heart sounds, no edema GI: abdomen soft, normal BS, no masses, no tenderness, no hepatomegaly, no splenomegaly EXTREMITIES:  Able to move 4 extremities; LUE on sling PSYCHIATRIC: the patient is alert & oriented to person, affect & behavior appropriate  LABS/RADIOLOGY: Labs reviewed: Basic Metabolic Panel:  Recent Labs  40/98/1101/02/11 1522 11/15/14 0604  NA 139 135  K 3.9 4.5  CL 105 101  CO2 27 30  GLUCOSE 145* 189*  BUN 23 23  CREATININE 1.17 1.33  CALCIUM 9.5 9.0   CBC:  Recent Labs  11/03/14 1522 11/15/14 0604  WBC 9.1 10.9*  HGB 13.3 11.4*  HCT 41.2 36.2*  MCV 91.6 94.5  PLT 230 211   CBG:  Recent Labs  11/16/14 2158 11/17/14 0628 11/17/14 1217  GLUCAP 109* 145* 183*    ASSESSMENT/PLAN:  Left rotator cuff tear arthropathy S/P reverse left total shoulder replacement - for home health PT, OT and home health aide; continue sling and will start shoulder motion; continue Xarelto 20 mg by mouth daily for DVT prophylaxis; baclofen 10 mg 1 tab by mouth 3 times a day when necessary for muscle spasm; Ultram 50 mg 1-2 tabs by mouth every 6 hours when necessary and Tylenol 1000 mg by mouth 3 times a day when necessary for pain Chronic diastolic CHF - stable; continue lisinopril 5 mg by mouth  daily and Lopressor 50 mg by mouth daily Diabetes mellitus, type II - hgbA1c 6.5; continue Glucotrol 5 mg by mouth every morning and metformin 1000 mg by mouth twice a day Hypertension - well controlled; continue lisinopril 5 mg by mouth daily and Lopressor 50 mg 1 tab by mouth twice a day Depression - mood is stable; continue Celexa 20 mg 1 tab by mouth daily Constipation - continue senna S2  tabs by mouth daily and Colace 100 mg by mouth twice a day Anxiety - continue Ativan 0.5 mg by mouth daily and Ativan 0.5 mg 1 tab by mouth twice a day when necessary Shuffling gait - to follow-up with neurology as outpatient   I have filled out patient's discharge paperwork and written prescriptions.  Patient will receive home health PT, OT and home health aide.  Total discharge time: Greater than 30 minutes  Discharge time involved coordination of the discharge process with social worker, nursing staff and therapy department. Medical justification for home health services verified.   [facility], NP [employer] [phone]

## 2015-01-24 ENCOUNTER — Encounter: Payer: Self-pay | Admitting: Neurology

## 2015-01-24 ENCOUNTER — Ambulatory Visit (INDEPENDENT_AMBULATORY_CARE_PROVIDER_SITE_OTHER): Payer: Medicare Other | Admitting: Neurology

## 2015-01-24 VITALS — BP 166/88 | HR 68 | Resp 14 | Ht 68.0 in | Wt 179.2 lb

## 2015-01-24 DIAGNOSIS — R269 Unspecified abnormalities of gait and mobility: Secondary | ICD-10-CM | POA: Diagnosis not present

## 2015-01-24 DIAGNOSIS — F413 Other mixed anxiety disorders: Secondary | ICD-10-CM | POA: Diagnosis not present

## 2015-01-24 DIAGNOSIS — R531 Weakness: Secondary | ICD-10-CM

## 2015-01-24 DIAGNOSIS — G20A1 Parkinson's disease without dyskinesia, without mention of fluctuations: Secondary | ICD-10-CM

## 2015-01-24 DIAGNOSIS — R251 Tremor, unspecified: Secondary | ICD-10-CM

## 2015-01-24 DIAGNOSIS — G20 Parkinson's disease: Secondary | ICD-10-CM | POA: Insufficient documentation

## 2015-01-24 MED ORDER — CARBIDOPA-LEVODOPA 25-100 MG PO TABS: 1.0000 | ORAL_TABLET | Freq: Three times a day (TID) | ORAL | Status: DC

## 2015-01-24 NOTE — Progress Notes (Signed)
[facility]  PATIENT: [name] [name] DOB: August 21, 1934  REFERRING DOCTOR OR PCP:  Vedia Coffer SOURCE: patient and medical records  _________________________________   HISTORICAL  CHIEF COMPLAINT:  Chief Complaint  Patient presents with  . Extremity Weakness    Sts. onset of weakness in both legs, difficulty walking, onset in Feb. 2016, after left shoulder surgery. Sts. his orthopaedic surgeon, Dr. Mardelle Matte, was concerned he'd had a stroke, but this was r/o with mri.  Sts. w/u for Parkinson's Dz. was also negative./fim  . Gait disturbance  . History of dvt    Hx. of dvt right leg 10 yrs. ago and has been on blood thinners since.  Sts. initially on Coumadin, but recently switched to Xarelto/fim    HISTORY OF PRESENT ILLNESS:  I had the pleasure of seeing your patient, [name] [name], at [facility] for neurologic consultation regarding his status and gait disturbance.   He had left shoulder surgery 11/14/2014.   He was hospitalized x 3 days and then transferred to rehab.   He was noted to have a worsening gait with poor balance and weakness in the left more than right leg.    He had an CT of the brain that ruled out CVA.     I reviewed the CT images and older MRI images from 09/11/2010 showing minimal small vessel changes and minimal atropjy and a large Virchow Robin space on the left.    He started to have a tremor, left > right shortly after the surgery.     Although he feels that all his symptoms started in February, his wife feels that he had milder problems with his gait over the past year that have progressed.  Compared to February, he feels better with some improvement of gait and leg weakness but continued voice tremor, hand tremor.  Gait is still shuffling some but is better than last month.   He does not note any change in his tremor with any activity or with any motion.  He notes his voice has changes over the past few months and he also has  had more trouble with handwriting.       He feels a jittery sensation inside his body.  He has had a lot of anxiety since the surgery.    He was first placed on Ativan, then on Busar and now is on Hydroxyzine.    He does not think it helps much.     Records from his primary care doctor, his hospital stay, lab results, MRI results were reviewed. MRI images were also reviewed.  REVIEW OF SYSTEMS: Constitutional: No fevers, chills, sweats, or change in appetite Eyes: No visual changes, double vision, eye pain Ear, nose and throat: No hearing loss, ear pain, nasal congestion, sore throat Cardiovascular: No chest pain, palpitations Respiratory: No shortness of breath at rest or with exertion.   No wheezes GastrointestinaI: No nausea, vomiting, diarrhea, abdominal pain, fecal incontinence Genitourinary: No dysuria, urinary retention or frequency.  No nocturia. Musculoskeletal: No neck pain, back pain Integumentary: No rash, pruritus, skin lesions Neurological: as above Psychiatric: No depression at this time.  No anxiety Endocrine: No palpitations, diaphoresis, change in appetite, change in weigh or increased thirst Hematologic/Lymphatic: No anemia, purpura, petechiae. Allergic/Immunologic: No itchy/runny eyes, nasal congestion, recent allergic reactions, rashes  ALLERGIES: Allergies  Allergen Reactions  . Other Other (See Comments)    Blood Products-Jehovas Witness Unknown Pain Medication caused hallucinations    HOME MEDICATIONS:  Current outpatient prescriptions:  .  acetaminophen (TYLENOL) 650 MG CR tablet, Take 650 mg by mouth., Disp: , Rfl:  .  bisacodyl (DULCOLAX) 10 MG suppository, Place 1 suppository (10 mg total) rectally daily as needed for moderate constipation., Disp: 12 suppository, Rfl: 0 .  Blood Glucose Monitoring Suppl (ONE TOUCH ULTRA SYSTEM KIT) W/DEVICE KIT, Use as instructed, Disp: , Rfl:  .  citalopram (CELEXA) 20 MG tablet, Take 20 mg by mouth daily. , Disp:  , Rfl:  .  glipiZIDE (GLUCOTROL) 5 MG tablet, Take 5 mg by mouth daily before breakfast., Disp: , Rfl:  .  glucose blood test strip, 100 each by Other route two (2) times a day as needed., Disp: , Rfl:  .  hydrOXYzine (ATARAX/VISTARIL) 10 MG tablet, Take 10 mg by mouth., Disp: , Rfl:  .  Lancets (ONETOUCH ULTRASOFT) lancets, by Does not apply route., Disp: , Rfl:  .  lisinopril (PRINIVIL,ZESTRIL) 5 MG tablet, Take 5 mg by mouth daily. , Disp: , Rfl:  .  metFORMIN (GLUMETZA) 500 MG (MOD) 24 hr tablet, Take 2,000 mg by mouth at bedtime. , Disp: , Rfl:  .  metoprolol (LOPRESSOR) 100 MG tablet, Take 50 mg by mouth 2 (two) times daily. , Disp: , Rfl:  .  rivaroxaban (XARELTO) 20 MG TABS tablet, Take 20 mg by mouth daily with supper. , Disp: , Rfl:  .  traMADol (ULTRAM) 50 MG tablet, Take 50 mg by mouth., Disp: , Rfl:  .  baclofen (LIORESAL) 10 MG tablet, Take 1 tablet (10 mg total) by mouth 3 (three) times daily. As needed for muscle spasm (Patient not taking: Reported on 01/24/2015), Disp: 50 tablet, Rfl: 0 .  docusate sodium (COLACE) 100 MG capsule, Take 1 capsule (100 mg total) by mouth 2 (two) times daily. (Patient not taking: Reported on 01/24/2015), Disp: 10 capsule, Rfl: 0 .  HYDROcodone-acetaminophen (NORCO) 10-325 MG per tablet, Take 1-2 tablets by mouth every 6 (six) hours as needed. (Patient not taking: Reported on 01/24/2015), Disp: 75 tablet, Rfl: 0 .  ondansetron (ZOFRAN) 4 MG tablet, Take 1 tablet (4 mg total) by mouth every 8 (eight) hours as needed for nausea or vomiting. (Patient not taking: Reported on 01/24/2015), Disp: 30 tablet, Rfl: 0 .  sennosides-docusate sodium (SENOKOT-S) 8.6-50 MG tablet, Take 2 tablets by mouth daily. (Patient not taking: Reported on 01/24/2015), Disp: 30 tablet, Rfl: 1  PAST MEDICAL HISTORY: Past Medical History  Diagnosis Date  . Blood clotting tendency     right leg went to lung  . Diabetes mellitus without complication   . History of kidney stones   .  Arthritis   . Left rotator cuff tear arthropathy 11/14/2014  . Movement disorder   . Hypertension   . Vision abnormalities     PAST SURGICAL HISTORY: Past Surgical History  Procedure Laterality Date  . Total knee arthroplasty      bilat  . Rotator cuff repair      bilat  . Cataract extraction w/ intraocular lens  implant, bilateral    . Reverse shoulder arthroplasty Left 11/14/2014    Procedure: REVERSE SHOULDER ARTHROPLASTY;  Surgeon: Johnny Bridge, MD;  Location: [place];  Service: Orthopedics;  Laterality: Left;    FAMILY HISTORY: Family History  Problem Relation Age of Onset  . Stroke Mother   . Healthy Father     SOCIAL HISTORY:  History   Social History  . Marital Status: Married    Spouse Name: N/A  . Number of Children: N/A  .  Years of Education: N/A   Occupational History  . Not on file.   Social History Main Topics  . Smoking status: Never Smoker   . Smokeless tobacco: Not on file  . Alcohol Use: No  . Drug Use: No  . Sexual Activity: Not on file   Other Topics Concern  . Not on file   Social History Narrative     PHYSICAL EXAM  Filed Vitals:   01/24/15 1448  BP: 166/88  Pulse: 68  Resp: 14  Height: _0  (1.727 m)  Weight: 179 lb 3.2 oz (81.285 kg)    Body mass index is 27.25 kg/(m^2).   General: The patient is well-developed and well-nourished and in no acute distress  Eyes:  Funduscopic exam shows normal optic discs and retinal vessels.  Neck: The neck is supple, no carotid bruits are noted.  The neck is nontender.  Cardiovascular: The heart has a regular rate and rhythm with a normal S1 and S2. There were no murmurs, gallops or rubs. Lungs are clear to auscultation.  Skin: Extremities are without significant edema.  Musculoskeletal:  Back is nontender  Neurologic Exam  Mental status:  He is bradykinetic .  He is alert and oriented x 3 at the time of the examination. The patient has apparent normal recent and remote memory,  with reduced  attention span and concentration ability.   Speech is normal.  Cranial nerves: Extraocular movements show mild decreased upgaze but good lateral gaze and down gaze.. Pupils are equal, round, and reactive to light and accomodation.  Visual fields are full.  Facial symmetry is present. There is good facial sensation to soft touch bilaterally.Facial strength is normal.  Trapezius and sternocleidomastoid strength is normal. No dysarthria is noted that voice is soft..  The tongue is midline, and the patient has symmetric elevation of the soft palate. No obvious hearing deficits are noted.  Motor:  He has a 4 Hz rest tremor in the left arm more than the right arm. Muscle bulk is normal.   Tone is increased with cogwheeling, left arm more than right arm. Strength is  5 / 5 in all 4 extremities.   Sensory: Sensory testing is intact to pinprick, soft touch and vibration sensation in all 4 extremities.  Coordination: Cerebellar testing reveals good finger-nose-finger and heel-to-shin bilaterally.  Gait and station: He needs help to get out of a chair. Station is normal.   Gait is wide with a reduced stride. He takes 8 steps to turn around 180. He cannot tandem walk.  He has severe retropulsion.. Romberg is negative.   Reflexes: Deep tendon reflexes are increased in his knees bilaterally with spread. Reflexes are increased at the ankles with there is no clonus.   Plantar responses are flexor.    DIAGNOSTIC DATA (LABS, IMAGING, TESTING) - I reviewed patient records, labs, notes, testing and imaging myself where available.  Lab Results  Component Value Date   WBC 10.9* 11/15/2014   HGB 11.4* 11/15/2014   HCT 36.2* 11/15/2014   MCV 94.5 11/15/2014   PLT 211 11/15/2014      Component Value Date/Time   NA 135 11/15/2014 0604   K 4.5 11/15/2014 0604   CL 101 11/15/2014 0604   CO2 30 11/15/2014 0604   GLUCOSE 189* 11/15/2014 0604   BUN 23 11/15/2014 0604   CREATININE 1.33 11/15/2014  0604   CALCIUM 9.0 11/15/2014 0604   PROT 7.4 11/14/2010 1315   ALBUMIN 3.0* 11/14/2010 1315   AST  24 11/14/2010 1315   ALT 35 11/14/2010 1315   ALKPHOS 71 11/14/2010 1315   BILITOT 0.3 11/14/2010 1315   GFRNONAA 50* 11/15/2014 0604   GFRAA 7* 11/15/2014 0604    Lab Results  Component Value Date   HGBA1C 6.5* 11/03/2014       ASSESSMENT AND PLAN  Parkinson disease  Gait disorder - Plan: MR Cervical Spine Wo Contrast  Tremor - Plan: MR Cervical Spine Wo Contrast  Other mixed anxiety disorders  Weakness - Plan: MR Cervical Spine Wo Contrast   In summary, Keyshon Stein is a 81 year old man with progressive gait disturbance that became much worse in February after a hospitalization for shoulder surgery. However, his family had noticed that he had had some issues with decrease gait over the preceding year. Compared to February, he is actually doing better, though still not as good as he was doing in January and earlier. On top of the issues with gait and leg weakness, he also has a tremor-worse on the left. He also has quite a bit of anxiety., Bradykinesia, left greater than right tremor and a gait disturbance with severe retropulsion. These are all very consistent with a diagnosis of Parkinson's disease. It is very possible that the worsening in February was due to anesthesia or due to perioperative medicines (especially if he got Phenergan or Reglan or related medicine for nausea).    I wrote a prescription for Sinemet 25/100. They will start at one half pill 3 times a day and titrate upwards as tolerated and for effect. I increase his citalopram for his anxiety. He will stop the hydroxyzine as he feels worse with it.   He needs to avoid neuroleptics. We also discussed state activity and continuing to use his walker.   I am concerned that his increased reflexes and some of his gait disturbance could also be due to cervical spine stenosis.  We will check an MRI of the cervical spine to  make sure that there is no spinal cord process.  He will return to see me in one month or sooner if he has new or worsening neurologic symptoms.   Jonai Weyland A. Felecia Shelling, MD, PhD 2/77/8242, 3:53 PM Certified in Neurology, Clinical Neurophysiology, Sleep Medicine, Pain Medicine and Neuroimaging  [facility] [address], [place], [place] [address] [phone]

## 2015-01-28 ENCOUNTER — Telehealth: Payer: Self-pay | Admitting: Neurology

## 2015-01-28 MED ORDER — CLONAZEPAM 0.5 MG PO TABS: 0.5000 mg | ORAL_TABLET | Freq: Every day | ORAL | Status: DC

## 2015-01-28 NOTE — Telephone Encounter (Signed)
I called the patient. The patient is having nervousness at this time. He has gone up to 40 mg of celexa, but he feels anxious inside. I will call in clonazepam 0.5 mg to take at night. He will call our office if he is not doing well.

## 2015-01-29 ENCOUNTER — Telehealth: Payer: Self-pay | Admitting: Neurology

## 2015-01-29 NOTE — Telephone Encounter (Signed)
Darel HongJudy, pt's wife called stating that she was speaking with Faith earlier today and has one more question to address. Please call and advice # [phone]

## 2015-01-29 NOTE — Telephone Encounter (Signed)
May still be too early to se the effect of a higher citalopram dose      If anxiety is only at night, we can try seroquel 25 mg nightly --- otherwise, might best to have him see psych Is his gait/tremor better with Sinemet?

## 2015-01-29 NOTE — Telephone Encounter (Signed)
The daughter, Nicholaus BloomKelley called back indicating that the patient has been on clonazepam, Ativan, hydroxyzine, and BuSpar previously, and all of these medications worsened the anxiety. The patient will not pick up the prescription for the clonazepam. The patient may require psychiatric referral for evaluation of the anxiety issues.

## 2015-01-29 NOTE — Telephone Encounter (Signed)
Spoke with Judi who again expressed concern that increase in Citalopram may not be effective for anxiety.  However, other more ir meds have not helped him in the past, or he has not been able to tolerate them.  Advised it may take a few weeks to notice full benefit of increased Citalopram, as he was just seen last week.  She verbalized understanding of same./fim

## 2015-02-17 ENCOUNTER — Ambulatory Visit
Admission: RE | Admit: 2015-02-17 | Discharge: 2015-02-17 | Disposition: A | Discharge status: Home / Self Care | Payer: Medicare Other | Source: Ambulatory Visit | Attending: Neurology | Admitting: Neurology

## 2015-02-17 DIAGNOSIS — R531 Weakness: Secondary | ICD-10-CM

## 2015-02-17 DIAGNOSIS — R269 Unspecified abnormalities of gait and mobility: Secondary | ICD-10-CM | POA: Diagnosis not present

## 2015-02-17 DIAGNOSIS — R251 Tremor, unspecified: Secondary | ICD-10-CM

## 2015-02-20 ENCOUNTER — Telehealth: Payer: Self-pay | Admitting: *Deleted

## 2015-02-20 ENCOUNTER — Ambulatory Visit: Payer: Medicare Other | Attending: Orthopedic Surgery | Admitting: Physical Therapy

## 2015-02-20 ENCOUNTER — Encounter: Payer: Self-pay | Admitting: Physical Therapy

## 2015-02-20 DIAGNOSIS — M25512 Pain in left shoulder: Secondary | ICD-10-CM | POA: Insufficient documentation

## 2015-02-20 DIAGNOSIS — M25612 Stiffness of left shoulder, not elsewhere classified: Secondary | ICD-10-CM | POA: Insufficient documentation

## 2015-02-20 DIAGNOSIS — R29898 Other symptoms and signs involving the musculoskeletal system: Secondary | ICD-10-CM | POA: Insufficient documentation

## 2015-02-20 DIAGNOSIS — R269 Unspecified abnormalities of gait and mobility: Secondary | ICD-10-CM | POA: Insufficient documentation

## 2015-02-20 NOTE — Telephone Encounter (Signed)
I have spoken with Darel HongJudy this morning and per RAS, advised that mri showed arthritic changes but spinal cord looks normal.  She verbalized understanding of same and asks what Windy FastRonald can take for pain if he needs it.  His med list shows he takes Hydrocodone.  I advised he can take that as directed--but due to age, and the way meds affect us as we age, all pain meds should be only prn.  Darel HongJudy verbalized understanding of same/fim

## 2015-02-20 NOTE — Telephone Encounter (Signed)
-----   Message from Asa Lenteichard A Sater, MD sent at 02/19/2015  3:19 PM EDT ----- MRI shows arthritic and disc changes but the spinal cord appears normal.     Is he better with Sinemet pills?

## 2015-02-20 NOTE — Therapy (Signed)
[facility] [place] [address]  [address] [place], [place], [address] Phone: [phone]   Fax:  [phone]  Physical Therapy Evaluation  Patient Details  Name: [name] JabsRonald L [name] MRN: [id number] Date of Birth: 11/20/1933 Referring Provider:  Teryl LucyLandau, Joshua, MD  Encounter Date: 02/20/2015      PT End of Session - 02/20/15 1508    Visit Number 1   Number of Visits 18   Date for PT Re-Evaluation 04/03/15   PT Start Time 1452   PT Stop Time 1535   PT Time Calculation (min) 43 min      Past Medical History  Diagnosis Date  . Blood clotting tendency     right leg went to lung  . Diabetes mellitus without complication   . History of kidney stones   . Arthritis   . Left rotator cuff tear arthropathy 11/14/2014  . Movement disorder   . Hypertension   . Vision abnormalities     Past Surgical History  Procedure Laterality Date  . Total knee arthroplasty      bilat  . Rotator cuff repair      bilat  . Cataract extraction w/ intraocular lens  implant, bilateral    . Reverse shoulder arthroplasty Left 11/14/2014    Procedure: REVERSE SHOULDER ARTHROPLASTY;  Surgeon: Eulas PostJoshua P Landau, MD;  Location: [facility];  Service: Orthopedics;  Laterality: Left;    There were no vitals filed for this visit.  Visit Diagnosis:  Shoulder stiffness, left  Shoulder weakness  Pain in joint, shoulder region, left  Abnormality of gait      Subjective Assessment - 02/20/15 1501    Subjective pt underwent L shoulder reverse total arthro[facility].  Upon discharge from hospital, [name] [facility] he participated in therapy daily (pt states this would be between 1-2 hours/day and otherwise would lie in bed).  He states prior to surgery he was going to gym 3 days/week for 1 hour.  [name] and was discharged from there on 02/15/15.  Currently, [name]  states shoulder seems a little stiff, "no real pain" but his CC is poor balance and gait.   Currently in Pain? Yes   Pain Score 6   infrequent pain noted with some movements   Pain Location Shoulder   Pain Orientation Left   Pain Frequency Occasional   Aggravating Factors  some movements (especially accidental quick movements)   Multiple Pain Sites No            [facility] PT Assessment - 02/20/15 0001    Assessment   Medical Diagnosis s/p L reverse total shoulder   Onset Date/Surgical Date 11/14/14   Hand Dominance Right   Balance Screen   Has the patient fallen in the past 6 months No   Has the patient had a decrease in activity level because of a fear of falling?  Yes   Is the patient reluctant to leave their home because of a fear of falling?  Yes   Home Environment   Living Environment Private residence   Living Arrangements Spouse/significant other   Type of Home House   Additional Comments requires assistance from wife with variety of ADLs   Prior Function   Leisure went to gym 3x/wk prior to surgery   Observation/Other Assessments   Focus on Therapeutic Outcomes (FOTO)  57% limitation   ROM /  Strength   AROM / PROM / Strength PROM;AROM   AROM   AROM Assessment Site Shoulder   Right/Left Shoulder Left;Right   Right Shoulder Flexion 115 Degrees   Left Shoulder Flexion 45 Degrees   PROM   PROM Assessment Site Shoulder   Right/Left Shoulder Left   Left Shoulder Flexion 72 Degrees   Left Shoulder ABduction 68 Degrees   Left Shoulder Internal Rotation 65 Degrees  @ 60 ABD   Left Shoulder External Rotation 12 Degrees  @ 60 ABD   Ambulation/Gait   Gait Comments gait is slow, very short B stride (inconsistent length but heel of lead foot often does not pass toes of stance foot), shuffling gait with minimal foot clearance   Standardized Balance Assessment   Standardized Balance Assessment Berg Balance Test   Berg Balance Test   Sit to Stand Able to stand without using  hands and stabilize independently   Standing Unsupported Able to stand safely 2 minutes   Sitting with Back Unsupported but Feet Supported on Floor or Stool Able to sit safely and securely 2 minutes   Stand to Sit Sits safely with minimal use of hands   Transfers Able to transfer safely, definite need of hands   Standing Unsupported with Eyes Closed Able to stand 10 seconds safely   Standing Ubsupported with Feet Together Needs help to attain position and unable to hold for 15 seconds   From Standing, Reach Forward with Outstretched Arm Can reach forward >12 cm safely (5")   From Standing Position, Pick up Object from Floor Able to pick up shoe, needs supervision   From Standing Position, Turn to Look Behind Over each Shoulder Turn sideways only but maintains balance   Turn 360 Degrees Able to turn 360 degrees safely but slowly   Standing Unsupported, Alternately Place Feet on Step/Stool Needs assistance to keep from falling or unable to try   Standing Unsupported, One Foot in Front Able to take small step independently and hold 30 seconds   Standing on One Leg Tries to lift leg/unable to hold 3 seconds but remains standing independently   Total Score 36                             PT Short Term Goals - 02/20/15 1648    PT SHORT TERM GOAL #1   Title pt instructed in and independent with initial HEP by 03/05/15   Status New           PT Long Term Goals - 02/20/15 1649    PT LONG TERM GOAL #1   Title pt independent with advanced HEP as necessary for continued progress by 04/03/15   PT LONG TERM GOAL #2   Title L Shoulder Elevation AROM to 115 degrees or better by 04/03/15   Status New   PT LONG TERM GOAL #3   Title pt scores 49/56 or better on Berg assessment by 04/03/15   Status New   PT LONG TERM GOAL #4   Title pt able to perform all ADLs and transfers independtly by 7/5/6   Status New   PT LONG TERM GOAL #5   Title L Shoulder ER AROM 35 at 90 ABD by 04/03/15    Status New               Plan - 02/20/15 1636    Clinical Impression Statement Mr. Mccauley is 14 weeks s/p L reverse total shoulder  arthroplasty.  Following surgery and stay at [facility], Mr. Pfost developed gait/balance disorder. He stayed at [facility] approx 6 weeks per pt repot and was then sent home with HHPT.  He states home health worked with him on his shoulder as well as gait/balance deficit.  Assessment today reveals poor balance (36/56 on Berg) as well as poor L Shoulder PROM and AROM (PROM Flexion 72, ABD 68, ER 12, and IR to 65 ; AROM Flexion to 45).  Mr. Lewers states he requires assistance from his wife for variety of ADLs as well as some transfers related to both shoulder weakness/LOM as well as balance issues..   Pt will benefit from skilled therapeutic intervention in order to improve on the following deficits Decreased range of motion;Decreased balance;Decreased mobility;Decreased strength;Difficulty walking;Postural dysfunction;Improper body mechanics;Pain;Impaired UE functional use   Rehab Potential Good   PT Frequency 3x / week   PT Duration 6 weeks   PT Treatment/Interventions ADLs/Self Care Home Management;Cryotherapy;Retail banker;Therapeutic activities;Therapeutic exercise;Balance training;Patient/family education;Manual techniques;Vasopneumatic Device   PT Next Visit Plan manual and AAROM for L Shoulder, balance/gait training   Consulted and Agree with Plan of Care Patient          G-Codes - March 01, 2015 1510    Functional Assessment Tool Used FOTO 57% limitation   Functional Limitation Carrying, moving and handling objects   Carrying, Moving and Handling Objects Current Status (Z6109) At least 40 percent but less than 60 percent impaired, limited or restricted   Carrying, Moving and Handling Objects Goal Status (U0454) At least 20 percent but less than 40 percent impaired, limited or restricted       Problem List Patient  Active Problem List   Diagnosis Date Noted  . Parkinson disease 01/24/2015  . Gait disorder 01/24/2015  . Tremor 01/24/2015  . Left rotator cuff tear arthropathy 11/14/2014  . Diabetes mellitus without complication 11/14/2014  . Blood clotting tendency 11/14/2014  . Malignant hypertension 11/14/2014  . Chronic diastolic heart failure 11/14/2014  . CKD (chronic kidney disease) stage 3, GFR 30-59 ml/min 11/14/2014  . Absolute anemia 03/26/2014  . Anxiety disorder 03/26/2014  . Benign essential HTN 03/26/2014  . Burning or prickling sensation 03/26/2014  . Cerumen impaction 03/26/2014  . Controlled diabetes mellitus type II without complication 03/26/2014  . Blood in the urine 03/26/2014  . Fatigue 03/26/2014  . Chalazion, suppurating 03/26/2014  . Calcium blood increased 03/26/2014  . Gonalgia 03/26/2014  . Elevated WBC count 03/26/2014  . Anemia, macrocytic 03/26/2014  . Arthritis, degenerative 03/26/2014  . Abnormal blood sugar 03/26/2014  . HLD (hyperlipidemia) 03/26/2014  . Basal cell papilloma 03/26/2014  . Arthralgia of shoulder 03/26/2014  . Rotator cuff arthropathy 03/26/2014  . Pain in the wrist 03/26/2014  . SCH (subconjunctival hemorrhage) 03/26/2014  . Limb swelling 03/26/2014  . Fast heart beat 03/26/2014  . Phlebectasia 03/26/2014    Kimothy Kishimoto PT, OCS 01-Mar-2015, 4:57 PM  [facility] [address]  [address] [place], [place], [address] Phone: [phone]   Fax:  [phone]

## 2015-02-21 ENCOUNTER — Telehealth: Payer: Self-pay | Admitting: Neurology

## 2015-02-21 NOTE — Telephone Encounter (Signed)
Pt's wife called and requested a refill for Rx. HYDROcodone-acetaminophen (NORCO) 10-325 MG per tablet. Told her that it would be ready within 24 hours unless notified otherwise by the nurse and she stated they would pick it up on the pt's 6/1 appt.

## 2015-02-21 NOTE — Telephone Encounter (Signed)
Patient is requesting a Rx for Hydrocodone.  It was last written by Teryl LucyJoshua Landau, MD.  Would you like to prescribe?  Please advise.  Thank you.

## 2015-02-21 NOTE — Telephone Encounter (Signed)
Ok to refill 

## 2015-02-22 ENCOUNTER — Ambulatory Visit: Payer: Medicare Other | Admitting: Physical Therapy

## 2015-02-22 DIAGNOSIS — M25612 Stiffness of left shoulder, not elsewhere classified: Secondary | ICD-10-CM

## 2015-02-22 DIAGNOSIS — R29898 Other symptoms and signs involving the musculoskeletal system: Secondary | ICD-10-CM

## 2015-02-22 DIAGNOSIS — R269 Unspecified abnormalities of gait and mobility: Secondary | ICD-10-CM

## 2015-02-22 MED ORDER — HYDROCODONE-ACETAMINOPHEN 10-325 MG PO TABS: 1.0000 | ORAL_TABLET | Freq: Four times a day (QID) | ORAL | Status: DC | PRN

## 2015-02-22 NOTE — Telephone Encounter (Signed)
Rx. printed, signed, up front [facility].  I have spoken with Darel HongJudy and let her know rx. is ready/fim

## 2015-02-22 NOTE — Therapy (Signed)
[facility] [address]  [address] [place], [place], [address] Phone: [phone]   Fax:  [phone]  Physical Therapy Treatment  Patient Details  Name: [name] JabsRonald L Valone MRN: [id number] Date of Birth: 05/13/1934 Referring Provider:  Teryl LucyLandau, Joshua, MD  Encounter Date: 02/22/2015      PT End of Session - 02/22/15 1758    Visit Number 2   Number of Visits 18   Date for PT Re-Evaluation 04/03/15   PT Start Time 1620   PT Stop Time 1700   PT Time Calculation (min) 40 min      Past Medical History  Diagnosis Date  . Blood clotting tendency     right leg went to lung  . Diabetes mellitus without complication   . History of kidney stones   . Arthritis   . Left rotator cuff tear arthropathy 11/14/2014  . Movement disorder   . Hypertension   . Vision abnormalities     Past Surgical History  Procedure Laterality Date  . Total knee arthroplasty      bilat  . Rotator cuff repair      bilat  . Cataract extraction w/ intraocular lens  implant, bilateral    . Reverse shoulder arthroplasty Left 11/14/2014    Procedure: REVERSE SHOULDER ARTHROPLASTY;  Surgeon: Eulas PostJoshua P Landau, MD;  Location: [facility] OR;  Service: Orthopedics;  Laterality: Left;    There were no vitals filed for this visit.  Visit Diagnosis:  Shoulder weakness  Shoulder stiffness, left  Abnormality of gait      Subjective Assessment - 02/22/15 1624    Subjective pt states he heard from neurologist yesterday and was told he does not have PKD.  States sees MD tomorrow to try and get "nerve medication" straightened out.   Patient Stated Goals improve balance and shoulder ROM   Currently in Pain? No/denies               TODAY'S TREATMENT TherEx - supine L Shoulder AAROM with PT A into Flexion 10 (achieved close to 80 degrees) and into Horiz ADD 10x (unable to get to neutral) Supine L Shoulder RROM  ER and IR at 40ish ABD 10x each, L Shoulder ABD  0-80ish 10x Seated L Low Row Yellow TB 10x Balance Training: narrow standing with EC 2x20" with close SBA March in place 60" Standing ALT Hip ABD with both hands on Single Pole 6x each (BW LOB requiring Mod A to prevent fall) Standing B Heel Raise with single pole A 10x (tends to experience retro LOB) Sit-<->Stand 5x without UE assist Alt FW mini lunge 8x each (experienced another retro LOB requiring Mod A to prevent fall)                    PT Short Term Goals - 02/22/15 1758    PT SHORT TERM GOAL #1   Title pt instructed in and independent with initial HEP by 03/05/15   Status On-going           PT Long Term Goals - 02/22/15 1758    PT LONG TERM GOAL #1   Title pt independent with advanced HEP as necessary for continued progress by 04/03/15   Status On-going   PT LONG TERM GOAL #2   Title L Shoulder Elevation AROM to 115 degrees or better by 04/03/15   Status On-going   PT LONG TERM GOAL #3   Title pt scores 49/56 or better on  Berg assessment by 04/03/15   Status On-going   PT LONG TERM GOAL #4   Title pt able to perform all ADLs and transfers independtly by 7/5/6   Status On-going   PT LONG TERM GOAL #5   Title L Shoulder ER AROM 35 at 90 ABD by 04/03/15   Status On-going               Plan - 02/22/15 1759    Clinical Impression Statement limited L shoulder ROM and strength but no pain with today's treatment.  Pt also being treated for poor balance/gait.  He tends to experience posterior LOB and requires Mod A or greater to prevent falls at times during balance training activities.   PT Next Visit Plan manual and AAROM/AROM for L Shoulder, balance/gait training (gait belt and close SBA to CGA)   Consulted and Agree with Plan of Care Patient        Problem List Patient Active Problem List   Diagnosis Date Noted  . Parkinson disease 01/24/2015  . Gait disorder 01/24/2015  . Tremor 01/24/2015  . Left rotator cuff tear arthropathy 11/14/2014  .  Diabetes mellitus without complication 11/14/2014  . Blood clotting tendency 11/14/2014  . Malignant hypertension 11/14/2014  . Chronic diastolic heart failure 11/14/2014  . CKD (chronic kidney disease) stage 3, GFR 30-59 ml/min 11/14/2014  . Absolute anemia 03/26/2014  . Anxiety disorder 03/26/2014  . Benign essential HTN 03/26/2014  . Burning or prickling sensation 03/26/2014  . Cerumen impaction 03/26/2014  . Controlled diabetes mellitus type II without complication 03/26/2014  . Blood in the urine 03/26/2014  . Fatigue 03/26/2014  . Chalazion, suppurating 03/26/2014  . Calcium blood increased 03/26/2014  . Gonalgia 03/26/2014  . Elevated WBC count 03/26/2014  . Anemia, macrocytic 03/26/2014  . Arthritis, degenerative 03/26/2014  . Abnormal blood sugar 03/26/2014  . HLD (hyperlipidemia) 03/26/2014  . Basal cell papilloma 03/26/2014  . Arthralgia of shoulder 03/26/2014  . Rotator cuff arthropathy 03/26/2014  . Pain in the wrist 03/26/2014  . SCH (subconjunctival hemorrhage) 03/26/2014  . Limb swelling 03/26/2014  . Fast heart beat 03/26/2014  . Phlebectasia 03/26/2014    Aerial Dilley PT, OCS 02/22/2015, 6:01 PM  [facility] [address]  [address] [place], [place], [address] Phone: [phone]   Fax:  [phone]

## 2015-02-28 ENCOUNTER — Ambulatory Visit: Payer: Medicare Other | Admitting: Neurology

## 2015-02-28 ENCOUNTER — Ambulatory Visit: Payer: Medicare Other | Admitting: Rehabilitation

## 2015-03-02 ENCOUNTER — Ambulatory Visit: Payer: Medicare Other | Attending: Orthopedic Surgery | Admitting: Physical Therapy

## 2015-03-02 DIAGNOSIS — R531 Weakness: Secondary | ICD-10-CM | POA: Diagnosis present

## 2015-03-02 DIAGNOSIS — M25512 Pain in left shoulder: Secondary | ICD-10-CM | POA: Diagnosis present

## 2015-03-02 DIAGNOSIS — M25612 Stiffness of left shoulder, not elsewhere classified: Secondary | ICD-10-CM | POA: Diagnosis present

## 2015-03-02 DIAGNOSIS — R29898 Other symptoms and signs involving the musculoskeletal system: Secondary | ICD-10-CM | POA: Insufficient documentation

## 2015-03-02 DIAGNOSIS — R269 Unspecified abnormalities of gait and mobility: Secondary | ICD-10-CM | POA: Diagnosis present

## 2015-03-02 NOTE — Therapy (Signed)
[facility] [address]  [address] [place], [place], [address] Phone: [phone]   Fax:  [phone]  Physical Therapy Treatment  Patient Details  Name: [name] [name] MRN: [id number] Date of Birth: 15-Sep-1934 Referring Provider:  Teryl Lucy, MD  Encounter Date: 03/02/2015      PT End of Session - 03/02/15 1012    Visit Number 3   Number of Visits 18   Date for PT Re-Evaluation 04/03/15   PT Start Time 0932   PT Stop Time 1012   PT Time Calculation (min) 40 min   Activity Tolerance Patient tolerated treatment well   Behavior During Therapy [facility] for tasks assessed/performed      Past Medical History  Diagnosis Date  . Blood clotting tendency     right leg went to lung  . Diabetes mellitus without complication   . History of kidney stones   . Arthritis   . Left rotator cuff tear arthropathy 11/14/2014  . Movement disorder   . Hypertension   . Vision abnormalities     Past Surgical History  Procedure Laterality Date  . Total knee arthroplasty      bilat  . Rotator cuff repair      bilat  . Cataract extraction w/ intraocular lens  implant, bilateral    . Reverse shoulder arthroplasty Left 11/14/2014    Procedure: REVERSE SHOULDER ARTHROPLASTY;  Surgeon: Eulas Post, MD;  Location: [facility] OR;  Service: Orthopedics;  Laterality: Left;    There were no vitals filed for this visit.  Visit Diagnosis:  Shoulder weakness  Abnormality of gait  Shoulder stiffness, left  Pain in joint, shoulder region, left      Subjective Assessment - 03/02/15 0937    Subjective no shoulder pain. denies falls.  also has RW that he uses some.   Patient Stated Goals improve balance and shoulder ROM   Currently in Pain? No/denies                         [facility] Adult PT Treatment/Exercise - 03/02/15 0953    Manual Therapy   Manual Therapy Passive ROM   Manual therapy comments L shoulder supine all motions            P[facility]) - 03/02/15 0953    PWR! exercises Moves in sitting   PWR! Up x10   PWR! Rock x10 bil; A needed for LUE   PWR! Twist x10 bil; min cues for technique   PWR! Step x10 bil   PWR! Up x10   PWR! Rock x10 bil; limited use of RUE               PT Short Term Goals - 02/22/15 1758    PT SHORT TERM GOAL #1   Title pt instructed in and independent with initial HEP by 03/05/15   Status On-going           PT Long Term Goals - 02/22/15 1758    PT LONG TERM GOAL #1   Title pt independent with advanced HEP as necessary for continued progress by 04/03/15   Status On-going   PT LONG TERM GOAL #2   Title L Shoulder Elevation AROM to 115 degrees or better by 04/03/15   Status On-going   PT LONG TERM GOAL #3   Title pt scores 49/56 or better on Berg assessment by 04/03/15   Status On-going   PT LONG  TERM GOAL #4   Title pt able to perform all ADLs and transfers independtly by 7/5/6   Status On-going   PT LONG TERM GOAL #5   Title L Shoulder ER AROM 35 at 90 ABD by 04/03/15   Status On-going               Plan - 03/02/15 1012    Clinical Impression Statement Performed large amplitude movements today to help with functional mobility.    PT Next Visit Plan manual and AAROM/AROM for L Shoulder, balance/gait training (gait belt and close SBA to CGA); needs MD note   Consulted and Agree with Plan of Care Patient        Problem List Patient Active Problem List   Diagnosis Date Noted  . Parkinson disease 01/24/2015  . Gait disorder 01/24/2015  . Tremor 01/24/2015  . Left rotator cuff tear arthropathy 11/14/2014  . Diabetes mellitus without complication 11/14/2014  . Blood clotting tendency 11/14/2014  . Malignant hypertension 11/14/2014  . Chronic diastolic heart failure 11/14/2014  . CKD (chronic kidney disease) stage 3, GFR 30-59 ml/min 11/14/2014  . Absolute anemia 03/26/2014  . Anxiety disorder 03/26/2014  . Benign essential HTN 03/26/2014  . Burning  or prickling sensation 03/26/2014  . Cerumen impaction 03/26/2014  . Controlled diabetes mellitus type II without complication 03/26/2014  . Blood in the urine 03/26/2014  . Fatigue 03/26/2014  . Chalazion, suppurating 03/26/2014  . Calcium blood increased 03/26/2014  . Gonalgia 03/26/2014  . Elevated WBC count 03/26/2014  . Anemia, macrocytic 03/26/2014  . Arthritis, degenerative 03/26/2014  . Abnormal blood sugar 03/26/2014  . HLD (hyperlipidemia) 03/26/2014  . Basal cell papilloma 03/26/2014  . Arthralgia of shoulder 03/26/2014  . Rotator cuff arthropathy 03/26/2014  . Pain in the wrist 03/26/2014  . SCH (subconjunctival hemorrhage) 03/26/2014  . Limb swelling 03/26/2014  . Fast heart beat 03/26/2014  . Phlebectasia 03/26/2014   Clarita CraneStephanie F Philip Eckersley, PT, DPT 03/02/2015 10:14 AM  [facility] [place] [address]  [address] [place], [place], [address] Phone: [phone]   Fax:  [phone]

## 2015-03-05 ENCOUNTER — Ambulatory Visit: Payer: Medicare Other | Admitting: Rehabilitation

## 2015-03-05 DIAGNOSIS — M25512 Pain in left shoulder: Secondary | ICD-10-CM

## 2015-03-05 DIAGNOSIS — R29898 Other symptoms and signs involving the musculoskeletal system: Secondary | ICD-10-CM

## 2015-03-05 DIAGNOSIS — M25612 Stiffness of left shoulder, not elsewhere classified: Secondary | ICD-10-CM

## 2015-03-05 DIAGNOSIS — R269 Unspecified abnormalities of gait and mobility: Secondary | ICD-10-CM

## 2015-03-05 NOTE — Therapy (Signed)
[facility] [address]  [address] [place], [place], [address] Phone: [phone]   Fax:  [phone]  Physical Therapy Treatment  Patient Details  Name: [name] [name] MRN: [id number] Date of Birth: 1933/10/17 Referring Provider:  Teryl Lucy, MD  Encounter Date: 03/05/2015      PT End of Session - 03/05/15 1314    Visit Number 4   Number of Visits 18   Date for PT Re-Evaluation 04/03/15   PT Start Time 1315   PT Stop Time 1354   PT Time Calculation (min) 39 min   Activity Tolerance Patient tolerated treatment well   Behavior During Therapy [facility] for tasks assessed/performed      Past Medical History  Diagnosis Date  . Blood clotting tendency     right leg went to lung  . Diabetes mellitus without complication   . History of kidney stones   . Arthritis   . Left rotator cuff tear arthropathy 11/14/2014  . Movement disorder   . Hypertension   . Vision abnormalities     Past Surgical History  Procedure Laterality Date  . Total knee arthroplasty      bilat  . Rotator cuff repair      bilat  . Cataract extraction w/ intraocular lens  implant, bilateral    . Reverse shoulder arthroplasty Left 11/14/2014    Procedure: REVERSE SHOULDER ARTHROPLASTY;  Surgeon: Eulas Post, MD;  Location: [facility] OR;  Service: Orthopedics;  Laterality: Left;    There were no vitals filed for this visit.  Visit Diagnosis:  Shoulder weakness  Abnormality of gait  Shoulder stiffness, left  Pain in joint, shoulder region, left      Subjective Assessment - 03/05/15 1318    Subjective Reports that last Friday he had a fall while stepping off a curb when leaving a restaurant. Says he fell on his elbow and scrapped it but thinks its ok. States the shoulder doesn't bother him that much and that he can transfer as he needs to and is able to sleep without problems. Reports shoulder hurts when he tried to lift it or when he sleeps on it.  States pain can get to a 7/10 after these motions but quickly subsides.    Currently in Pain? No/denies   Aggravating Factors  Sleeping on it, moving it too quickly, lifting it.       TODAY'S TREATMENT TherEx - Supine Lt Shoulder AAROM with PTA into Flexion x10 then with ABD x10 Supine Lt Serratus punche AAROM with PTA x10 Supine Lt AAROM circles at 70-80 degrees flexion x10 cw/ccw Supine Lt Shoulder RROM ER and IR at 40ish ABD 10x each Seated Lt Low Row Yellow TB 10x, Lt Extension Yellow TB 10x Toe tape to 8" step x10 with 2 occations of catching foot on step (1 HHA on chair, CGA) March in place 2x60" (no HHA, CGA) Standing Bil Heel Raises x12 (1 HHA on chair, CGA) Standing ALT Hip Abd x10 (both HHA on one pole, CGA) Sit to stand without UE assist x6  Measurements: AROM: Flexion 60 PROM: Flexion 84   Abduction 74   IR 65 (at 60 degrees abduction)   ER 13 (at 60 degrees abduction)      PT Short Term Goals - 02/22/15 1758    PT SHORT TERM GOAL #1   Title pt instructed in and independent with initial HEP by 03/05/15   Status On-going  PT Long Term Goals - 02/22/15 1758    PT LONG TERM GOAL #1   Title pt independent with advanced HEP as necessary for continued progress by 04/03/15   Status On-going   PT LONG TERM GOAL #2   Title L Shoulder Elevation AROM to 115 degrees or better by 04/03/15   Status On-going   PT LONG TERM GOAL #3   Title pt scores 49/56 or better on Berg assessment by 04/03/15   Status On-going   PT LONG TERM GOAL #4   Title pt able to perform all ADLs and transfers independtly by 7/5/6   Status On-going   PT LONG TERM GOAL #5   Title L Shoulder ER AROM 35 at 90 ABD by 04/03/15   Status On-going               Plan - 03/05/15 1358    Clinical Impression Statement No LOB noted with balance activities today but pt continues to demonstrate slow movements. Continued to use gait belt along with CGA for all balance activities. Good improvements  noted with Lt shoulder AROM and PROM and no noticable set backs after his fall on Friday night. Good tolernace to new AAROM exercises today without complaint of pain or problems.    PT Next Visit Plan manual and AAROM/AROM for L Shoulder, balance/gait training (gait belt and close SBA to CGA)   Consulted and Agree with Plan of Care Patient        Problem List Patient Active Problem List   Diagnosis Date Noted  . Parkinson disease 01/24/2015  . Gait disorder 01/24/2015  . Tremor 01/24/2015  . Left rotator cuff tear arthropathy 11/14/2014  . Diabetes mellitus without complication 11/14/2014  . Blood clotting tendency 11/14/2014  . Malignant hypertension 11/14/2014  . Chronic diastolic heart failure 11/14/2014  . CKD (chronic kidney disease) stage 3, GFR 30-59 ml/min 11/14/2014  . Absolute anemia 03/26/2014  . Anxiety disorder 03/26/2014  . Benign essential HTN 03/26/2014  . Burning or prickling sensation 03/26/2014  . Cerumen impaction 03/26/2014  . Controlled diabetes mellitus type II without complication 03/26/2014  . Blood in the urine 03/26/2014  . Fatigue 03/26/2014  . Chalazion, suppurating 03/26/2014  . Calcium blood increased 03/26/2014  . Gonalgia 03/26/2014  . Elevated WBC count 03/26/2014  . Anemia, macrocytic 03/26/2014  . Arthritis, degenerative 03/26/2014  . Abnormal blood sugar 03/26/2014  . HLD (hyperlipidemia) 03/26/2014  . Basal cell papilloma 03/26/2014  . Arthralgia of shoulder 03/26/2014  . Rotator cuff arthropathy 03/26/2014  . Pain in the wrist 03/26/2014  . SCH (subconjunctival hemorrhage) 03/26/2014  . Limb swelling 03/26/2014  . Fast heart beat 03/26/2014  . Phlebectasia 03/26/2014    Ronney LionUCKER, Korrina Zern J, PTA 03/05/2015, 2:07 PM  [facility] [place] [address]  [address] [place], [place], [address] Phone: [phone]   Fax:  [phone]

## 2015-03-06 ENCOUNTER — Encounter: Payer: Self-pay | Admitting: Neurology

## 2015-03-06 ENCOUNTER — Telehealth: Payer: Self-pay | Admitting: Neurology

## 2015-03-06 ENCOUNTER — Ambulatory Visit (INDEPENDENT_AMBULATORY_CARE_PROVIDER_SITE_OTHER): Payer: Medicare Other | Admitting: Neurology

## 2015-03-06 VITALS — BP 174/88 | HR 70 | Resp 14 | Ht 68.0 in | Wt 177.4 lb

## 2015-03-06 DIAGNOSIS — R251 Tremor, unspecified: Secondary | ICD-10-CM

## 2015-03-06 DIAGNOSIS — G20 Parkinson's disease: Secondary | ICD-10-CM | POA: Diagnosis not present

## 2015-03-06 DIAGNOSIS — F411 Generalized anxiety disorder: Secondary | ICD-10-CM | POA: Diagnosis not present

## 2015-03-06 DIAGNOSIS — R269 Unspecified abnormalities of gait and mobility: Secondary | ICD-10-CM | POA: Diagnosis not present

## 2015-03-06 MED ORDER — CARBIDOPA-LEVODOPA ER 50-200 MG PO TBCR: 1.0000 | EXTENDED_RELEASE_TABLET | Freq: Every day | ORAL | Status: DC

## 2015-03-06 MED ORDER — BUSPIRONE HCL 10 MG PO TABS: 10.0000 mg | ORAL_TABLET | Freq: Two times a day (BID) | ORAL | Status: DC

## 2015-03-06 NOTE — Telephone Encounter (Signed)
It appears these Rx's were sent today at [facility], receipt confirmed by pharmacy,  I called back to verify the pharmacy info was correct.  She verified it is.  Says she called them and they do not have Rx's ready.  I think perhaps they just have not had an opportunity to fill them yet.  Advised I will be happy to call them and follow up.  I called the pharmacy.  Spoke with April.  She verified they do have Rx's and are in the process of filling them now.  They will notify patient when Rx's are ready.

## 2015-03-06 NOTE — Progress Notes (Signed)
[facility]  PATIENT: GERMANY CHELF DOB: [birth year]  REFERRING DOCTOR OR PCP:  Burnett Kanaris SOURCE: patient and medical records  _________________________________   HISTORICAL  CHIEF COMPLAINT:  Chief Complaint  Patient presents with  . Parkinson's Disease    Sts. walking has improved since starting Sinemet.  Sts. he feels more confident when walking.  Sts. one fall since last ov--he was stepping off of a curb and fell--sustained just superficial abrasions.  Sts. is still feeling anxious in the morning.  Sts. pcp asked him to stop the Ativan so he did./fim    HISTORY OF PRESENT ILLNESS:  Candy Ziegler is a 81 yo man with tremor and gait disturbance, diagnosed with Parkinson's disease at his last visit. He reports doing better on Sinemet.  Parkinson's Disease:   Since starting Sinemet, he feels that his gait has significantly improved and his tremor has almost resolved. He is now able to turn over in bed time sometimes but not always. He feels his gait is worse first thing in the morning. He tolerates Sinemet well.       Gait:  Although gait is improved, he continues to feel off balance and he did fall a couple weeks ago.   His wife feels he is less bradykinetic than he was before the Sinemet.  Anxiety:   He has anxiety that is usually worse shortly after waking up and then better as day goes on.     Sometimes, he feels a jittery sensation inside his body.     He was first placed on Ativan, then on Buspar (only tried for a couple days) and now is on Hydroxyzine.   He is also on citalopram. He feels it may have helped his mood little bit but not anxiety.     REVIEW OF SYSTEMS: Constitutional: No fevers, chills, sweats, or change in appetite Eyes: No visual changes, double vision, eye pain Ear, nose and throat: No hearing loss, ear pain, nasal congestion, sore throat Cardiovascular: No chest pain, palpitations Respiratory: No shortness of breath at rest or  with exertion.   No wheezes GastrointestinaI: No nausea, vomiting, diarrhea, abdominal pain, fecal incontinence Genitourinary: No dysuria, urinary retention or frequency.  No nocturia. Musculoskeletal: No neck pain, back pain Integumentary: No rash, pruritus, skin lesions Neurological: as above Psychiatric: No depression at this time.  No anxiety Endocrine: No palpitations, diaphoresis, change in appetite, change in weigh or increased thirst Hematologic/Lymphatic: No anemia, purpura, petechiae. Allergic/Immunologic: No itchy/runny eyes, nasal congestion, recent allergic reactions, rashes  ALLERGIES: Allergies  Allergen Reactions  . Other Other (See Comments)    Blood Products-[employer]ov[employer] Witness Unknown Pain Medication caused hallucinations    HOME MEDICATIONS:  Current outpatient prescriptions:  .  acetaminophen (TYLENOL) 650 MG CR tablet, Take 650 mg by mouth., Disp: , Rfl:  .  bisacodyl (DULCOLAX) 10 MG suppository, Place 1 suppository (10 mg total) rectally daily as needed for moderate constipation., Disp: 12 suppository, Rfl: 0 .  Blood Glucose Monitoring Suppl (ONE TOUCH ULTRA SYSTEM KIT) W/DEVICE KIT, Use as instructed, Disp: , Rfl:  .  carbidopa-levodopa (SINEMET) 25-100 MG per tablet, Take 1 tablet by mouth 3 (three) times daily., Disp: 90 tablet, Rfl: 5 .  citalopram (CELEXA) 20 MG tablet, Take 20 mg by mouth daily. , Disp: , Rfl:  .  glipiZIDE (GLUCOTROL) 5 MG tablet, Take 5 mg by mouth., Disp: , Rfl:  .  glucose blood test strip, 100 each by Other route two (2) times a day  as needed., Disp: , Rfl:  .  Lancets (ONETOUCH ULTRASOFT) lancets, by Does not apply route., Disp: , Rfl:  .  lisinopril (PRINIVIL,ZESTRIL) 5 MG tablet, Take 5 mg by mouth daily. , Disp: , Rfl:  .  metFORMIN (GLUMETZA) 500 MG (MOD) 24 hr tablet, Take 2,000 mg by mouth at bedtime. , Disp: , Rfl:  .  metoprolol (LOPRESSOR) 100 MG tablet, Take 50 mg by mouth 2 (two) times daily. , Disp: , Rfl:  .   rivaroxaban (XARELTO) 20 MG TABS tablet, Take 20 mg by mouth daily with supper. , Disp: , Rfl:  .  docusate sodium (COLACE) 100 MG capsule, Take 1 capsule (100 mg total) by mouth 2 (two) times daily. (Patient not taking: Reported on 01/24/2015), Disp: 10 capsule, Rfl: 0 .  HYDROcodone-acetaminophen (NORCO) 10-325 MG per tablet, Take 1-2 tablets by mouth every 6 (six) hours as needed. (Patient not taking: Reported on 03/06/2015), Disp: 75 tablet, Rfl: 0 .  ondansetron (ZOFRAN) 4 MG tablet, Take 1 tablet (4 mg total) by mouth every 8 (eight) hours as needed for nausea or vomiting. (Patient not taking: Reported on 01/24/2015), Disp: 30 tablet, Rfl: 0 .  sennosides-docusate sodium (SENOKOT-S) 8.6-50 MG tablet, Take 2 tablets by mouth daily. (Patient not taking: Reported on 01/24/2015), Disp: 30 tablet, Rfl: 1  PAST MEDICAL HISTORY: Past Medical History  Diagnosis Date  . Blood clotting tendency     right leg went to lung  . Diabetes mellitus without complication   . History of kidney stones   . Arthritis   . Left rotator cuff tear arthropathy 11/14/2014  . Movement disorder   . Hypertension   . Vision abnormalities     PAST SURGICAL HISTORY: Past Surgical History  Procedure Laterality Date  . Total knee arthroplasty      bilat  . Rotator cuff repair      bilat  . Cataract extraction w/ intraocular lens  implant, bilateral    . Reverse shoulder arthroplasty Left 11/14/2014    Procedure: REVERSE SHOULDER ARTHROPLASTY;  Surgeon: Johnny Bridge, MD;  Location: [place];  Service: Orthopedics;  Laterality: Left;    FAMILY HISTORY: Family History  Problem Relation Age of Onset  . Stroke Mother   . Healthy Father     SOCIAL HISTORY:  History   Social History  . Marital Status: Married    Spouse Name: N/A  . Number of Children: N/A  . Years of Education: N/A   Occupational History  . Not on file.   Social History Main Topics  . Smoking status: Never Smoker   . Smokeless tobacco: Not  on file  . Alcohol Use: No  . Drug Use: No  . Sexual Activity: Not on file   Other Topics Concern  . Not on file   Social History Narrative     PHYSICAL EXAM  Filed Vitals:   03/06/15 1126  BP: 174/88  Pulse: 70  Resp: 14  Height: $Remove'5\' 8"'RHthnQH$  (1.727 m)  Weight: 177 lb 6.4 oz (80.468 kg)    Body mass index is 26.98 kg/(m^2).   General: The patient is well-developed and well-nourished and in no acute distress  Neurologic Exam  Mental status:  He is bradykinetic but better than last visit.  He is alert and oriented x 3 at the time of the examination. The patient has apparent normal recent and remote memory, with reduced  attention span and concentration ability.   Speech is normal.  Cranial nerves: Extraocular  movements show mild decreased upgaze but normal lateral gaze and down gaze..   Facial symmetry is present. There is good facial sensation to soft touch bilaterally.Facial strength is normal.  Trapezius and sternocleidomastoid strength is normal. No dysarthria is noted The tongue is midline, and the patient has symmetric elevation of the soft palate. No obvious hearing deficits are noted.  Motor:  He has a very minimal 4 Hz rest tremor in the left arm only (better) Muscle bulk is normal.   Tone is increased with cogwheeling, left arm more than right arm but better than lastr time. Strength is  5 / 5 in all 4 extremities.   Sensory: Sensory testing is intact to pinprick, soft touch and vibration sensation in all 4 extremities.  Coordination: Cerebellar testing reveals good finger-nose-finger and heel-to-shin bilaterally.  Gait and station: He needs help to get out of a chair. Station is normal.   Gait is wide with a reduced stride (but better). He takes 4 steps to turn around 180. He cannot tandem walk.  He has moderate retropulsion.. Romberg is negative.  Reflexes: Deep tendon reflexes are increased in his knees bilaterally with spread. Reflexes are increased at the ankles with  there is no clonus.       DIAGNOSTIC DATA (LABS, IMAGING, TESTING) - I reviewed patient records, labs, notes, testing and imaging myself where available.  Lab Results  Component Value Date   WBC 10.9* 11/15/2014   HGB 11.4* 11/15/2014   HCT 36.2* 11/15/2014   MCV 94.5 11/15/2014   PLT 211 11/15/2014      Component Value Date/Time   NA 135 11/15/2014 0604   K 4.5 11/15/2014 0604   CL 101 11/15/2014 0604   CO2 30 11/15/2014 0604   GLUCOSE 189* 11/15/2014 0604   BUN 23 11/15/2014 0604   CREATININE 1.33 11/15/2014 0604   CALCIUM 9.0 11/15/2014 0604   PROT 7.4 11/14/2010 1315   ALBUMIN 3.0* 11/14/2010 1315   AST 24 11/14/2010 1315   ALT 35 11/14/2010 1315   ALKPHOS 71 11/14/2010 1315   BILITOT 0.3 11/14/2010 1315   GFRNONAA 50* 11/15/2014 0604   GFRAA 69* 11/15/2014 0604    Lab Results  Component Value Date   HGBA1C 6.5* 11/03/2014       ASSESSMENT AND PLAN  Parkinson disease  Gait disorder  Generalized anxiety disorder  Tremor   1.  Continue Sinemet 25/100 3 times a day and add Sinemet CR 25/250 at bedtime as this might help him turn over in bed and do better in the morning. 2.  At the next visit, I will consider adding Azilect or selegiline. 3.  Avoid neuroleptics such as Phenergan or Reglan or related medicine for nausea.    4.  Stay active.    Return to clinic in 4 months or sooner if there are new or worsening neurologic symptoms.   Richard A. Felecia Shelling, MD, PhD 06/01/7901, 40:97 AM Certified in Neurology, Clinical Neurophysiology, Sleep Medicine, Pain Medicine and Neuroimaging  [facility], [facility] [address], [place], [place] [address] [phone]

## 2015-03-06 NOTE — Telephone Encounter (Signed)
Patients wife(Hubbard,Judi) called inquiring when scripts busPIRone (BUSPAR) 10 MG tablet and carbidopa-levodopa (SINEMET) 25-100 MG per tablet will be called in. I told her to check with pharmacy later today or even possibly tomorrow. She can be reached at [phone]

## 2015-03-07 ENCOUNTER — Telehealth: Payer: Self-pay | Admitting: Neurology

## 2015-03-07 ENCOUNTER — Ambulatory Visit: Payer: Medicare Other | Admitting: Physical Therapy

## 2015-03-07 NOTE — Telephone Encounter (Signed)
Patients wife Darel Hong(Judy) called regarding busPIRone (BUSPAR) 10 MG tablet and carbidopa-levodopa (SINEMET CR) 50-200 MG per tablet . He took carbidopa-levodopa (SINEMET) 25-100 MG per tablet-3x yesterday as instructed and the sinemet 50-200 at bedtime, he slept very hard during night. Also yesterday he started 1 dose of  buspar and 1 dose today also. He is complaining of increased nervousness. She is inquiring what to do as she doesn't know which medication caused the nervousness. Please call and advise. She can be reached at [phone].

## 2015-03-07 NOTE — Telephone Encounter (Signed)
I have spoken with [name] [name] and [name] [name] and per RAS, advised they decrease hs dose of Sinemet by half.  Try this for a couple of days, if still more anxious, will probably decrease Buspar.  They both are agreeable with this plan and will call me back if need/fim

## 2015-03-08 ENCOUNTER — Ambulatory Visit: Payer: Medicare Other | Admitting: Rehabilitation

## 2015-03-08 DIAGNOSIS — R269 Unspecified abnormalities of gait and mobility: Secondary | ICD-10-CM

## 2015-03-08 DIAGNOSIS — M25612 Stiffness of left shoulder, not elsewhere classified: Secondary | ICD-10-CM

## 2015-03-08 DIAGNOSIS — R29898 Other symptoms and signs involving the musculoskeletal system: Secondary | ICD-10-CM

## 2015-03-08 DIAGNOSIS — M25512 Pain in left shoulder: Secondary | ICD-10-CM

## 2015-03-08 NOTE — Therapy (Signed)
[facility] [address]  [address] [place], [place], [address] Phone: [phone]   Fax:  [phone]  Physical Therapy Treatment  Patient Details  Name: [name] [name] MRN: [id number] Date of Birth: 04-27-1934 Referring Provider:  Teryl Lucy, MD  Encounter Date: 03/08/2015      PT End of Session - 03/08/15 1315    Visit Number 5   Number of Visits 18   Date for PT Re-Evaluation 03/31/15   PT Start Time 1315   PT Stop Time 1355   PT Time Calculation (min) 40 min   Activity Tolerance Patient tolerated treatment well   Behavior During Therapy [facility] for tasks assessed/performed      Past Medical History  Diagnosis Date  . Blood clotting tendency     right leg went to lung  . Diabetes mellitus without complication   . History of kidney stones   . Arthritis   . Left rotator cuff tear arthropathy 11/14/2014  . Movement disorder   . Hypertension   . Vision abnormalities     Past Surgical History  Procedure Laterality Date  . Total knee arthroplasty      bilat  . Rotator cuff repair      bilat  . Cataract extraction w/ intraocular lens  implant, bilateral    . Reverse shoulder arthroplasty Left 11/14/2014    Procedure: REVERSE SHOULDER ARTHROPLASTY;  Surgeon: Eulas Post, MD;  Location: [facility];  Service: Orthopedics;  Laterality: Left;    There were no vitals filed for this visit.  Visit Diagnosis:  Shoulder weakness  Abnormality of gait  Shoulder stiffness, left  Pain in joint, shoulder region, left      Subjective Assessment - 03/08/15 1321    Subjective States MD changed his medications this week to help with the anxiety. Reports he is trying to use his shoulder more and his shoulder doesn't jerk like it use to. States he had kidney stones yesterday and wasn't able to come.    Currently in Pain? No/denies      TODAY'S TREATMENT TherEx - Supine Lt Serratus punches AAROM with PTA x10 Supine Lt AAROM  circles at 70-80 degrees flexion x10 cw/ccw Supine Lt Shoulder RROM ER and IR at 40ish ABD 10x each Supine Wand Flexion 10x Supine Lt Shoulder AAROM with PTA into ABD x10 Sit to stand without WE assist x10 Side stepping over 1/2 foam roll x10 (1 HHA on counter an CGA, 1 LOB requiring Min A to recover) Fwd/Bwd stepping over 1/2 foam roll x10 each foot (1 HHA on counter and CGA, occasionally catching his foot on the foam but no LOB from this) March in place 2x30" (no HHA, SBA; noted some Rt hip drop when raising Rt leg) Standing Alt Hip Abd x12 (1 HHA on counter, CGA) Standing Bil Heel Raises x15 (1 HHA on counter, CGA)  Standing Feet together on Blue Foam 2x15" (no HHA, SBA), then with head turns x5 each way (fingertip assist, SBA)       PT Short Term Goals - 02/22/15 1758    PT SHORT TERM GOAL #1   Title pt instructed in and independent with initial HEP by 03/05/15   Status On-going           PT Long Term Goals - 02/22/15 1758    PT LONG TERM GOAL #1   Title pt independent with advanced HEP as necessary for continued progress by 04/03/15   Status On-going  PT LONG TERM GOAL #2   Title L Shoulder Elevation AROM to 115 degrees or better by 04/03/15   Status On-going   PT LONG TERM GOAL #3   Title pt scores 49/56 or better on Berg assessment by 04/03/15   Status On-going   PT LONG TERM GOAL #4   Title pt able to perform all ADLs and transfers independtly by 7/5/6   Status On-going   PT LONG TERM GOAL #5   Title L Shoulder ER AROM 35 at 90 ABD by 04/03/15   Status On-going               Plan - 03/08/15 1330    Clinical Impression Statement Good tolerance to all exericses, had one LOB but able to recover with Min A. Did note some Rt hip drop during marching exercise today. Will remeasure Lt shoulder at next visit due to MD visit next Wednesday. Pt continues to seem motivated to improve with therapy for his shoulder and balance disorder.    PT Next Visit Plan manual and  AAROM/AROM for L Shoulder, balance/gait training (gait belt and close SBA to CGA), MD notes   Consulted and Agree with Plan of Care Patient        Problem List Patient Active Problem List   Diagnosis Date Noted  . Parkinson disease 01/24/2015  . Gait disorder 01/24/2015  . Tremor 01/24/2015  . Abnormal gait 01/24/2015  . Idiopathic Parkinson's disease 01/24/2015  . Left rotator cuff tear arthropathy 11/14/2014  . Diabetes mellitus without complication 11/14/2014  . Blood clotting tendency 11/14/2014  . Malignant hypertension 11/14/2014  . Chronic diastolic heart failure 11/14/2014  . CKD (chronic kidney disease) stage 3, GFR 30-59 ml/min 11/14/2014  . Absolute anemia 03/26/2014  . Anxiety disorder 03/26/2014  . Benign essential HTN 03/26/2014  . Burning or prickling sensation 03/26/2014  . Cerumen impaction 03/26/2014  . Controlled diabetes mellitus type II without complication 03/26/2014  . Blood in the urine 03/26/2014  . Fatigue 03/26/2014  . Chalazion, suppurating 03/26/2014  . Calcium blood increased 03/26/2014  . Gonalgia 03/26/2014  . Elevated WBC count 03/26/2014  . Anemia, macrocytic 03/26/2014  . Arthritis, degenerative 03/26/2014  . Abnormal blood sugar 03/26/2014  . HLD (hyperlipidemia) 03/26/2014  . Basal cell papilloma 03/26/2014  . Arthralgia of shoulder 03/26/2014  . Rotator cuff arthropathy 03/26/2014  . Pain in the wrist 03/26/2014  . SCH (subconjunctival hemorrhage) 03/26/2014  . Limb swelling 03/26/2014  . Fast heart beat 03/26/2014  . Phlebectasia 03/26/2014    Ronney Lion, PTA  03/08/2015, 1:59 PM  [facility] [address]  [address] [place], [place], [address] Phone: [phone]   Fax:  [phone]

## 2015-03-09 NOTE — Telephone Encounter (Signed)
Patient's wife is calling back regarding the patient's busPIRone (BUSPAR) 10 MG tablet. She states the patient get's jittery around lunch and she wants to know if it is ok to give the patient another Buspar at lunch. The last note states to possibly decrease busPIRone (BUSPAR) 10 MG tablet. Please call patient's wife to discuss.

## 2015-03-09 NOTE — Telephone Encounter (Signed)
[facility].  Per Dr. Epimenio Foot, [name] [name] can try taking an extra Buspar at lunch, but if no improvement is seen, do not continue this extra dose. [name] [name] also often responds well to simple reassurance.  Distraction techniques also have helped in the past/fim

## 2015-03-12 ENCOUNTER — Ambulatory Visit: Payer: Medicare Other | Admitting: Physical Therapy

## 2015-03-12 MED ORDER — QUETIAPINE FUMARATE 25 MG PO TABS: 25.0000 mg | ORAL_TABLET | Freq: Every day | ORAL | Status: DC

## 2015-03-12 NOTE — Telephone Encounter (Signed)
I have spoken with both [name] [name] and [name] [name] this afternoon, and per RAS, advised that for sx. of continued anxiety, restlessness at night, he can decrease Buspar back to bid, and add Seroquel 25mg  qhs.  Both [name] [name] and [name] [name] are agreeable with this plan.  Rx. escribed to [facility] per their request/fim

## 2015-03-12 NOTE — Telephone Encounter (Signed)
I have spoken with Darel Hong this afternoon.  She sts. Ron remains agitated, restless at night, can't get comfortable.  They have tried cutting hs dose of Sinemet in half, and have tried adding a 3rd dose of Buspar during the day, and these have not helped.  I will check with RAS to see what other options may be available and call Darel Hong back/fim

## 2015-03-12 NOTE — Telephone Encounter (Signed)
Patients wife called back and requested to speak with Faith RN. Regarding the medication previously discussed. Please call and advise.

## 2015-03-12 NOTE — Addendum Note (Signed)
Addended by: Candis Schatz I on: 03/12/2015 04:14 PM   Modules accepted: Orders

## 2015-03-14 ENCOUNTER — Ambulatory Visit: Payer: Medicare Other | Admitting: Rehabilitation

## 2015-03-14 ENCOUNTER — Telehealth: Payer: Self-pay | Admitting: Neurology

## 2015-03-14 NOTE — Telephone Encounter (Signed)
I have spoken with Darel Hong this afternoon--and per RAS, advised that 25mg  is the lowest dose Seroquel comes in.  Per RAS, I have also advised that as he has not been able to control anxiety with mult. med changes, at this time he would like to refer Ron to a psychiatrist for eval/tx of anxiety.  Darel Hong spoke with Ron while we were on the phone and sts. he does not want psych referral at this time.  Sts. Ron says he will try Seroquel for a few more days to see if he is able to tolerate it better.  Darel Hong will call me back if she has further questions/fim

## 2015-03-14 NOTE — Telephone Encounter (Signed)
Judi, patient's wife called stating that the patient only took one tablet last night of QUEtiapine (SEROQUEL) 25 MG tablet due to no instructions attached to the bottle on how many he should take. Judi also thinks the dosage os too strong for the patient and would like to know if the 25mg  can be cut back. Please call and advise. Judi can be reached @ [phone]

## 2015-03-15 ENCOUNTER — Ambulatory Visit: Payer: Medicare Other | Admitting: Rehabilitation

## 2015-03-19 ENCOUNTER — Ambulatory Visit: Payer: Medicare Other | Admitting: Physical Therapy

## 2015-03-19 DIAGNOSIS — M25612 Stiffness of left shoulder, not elsewhere classified: Secondary | ICD-10-CM

## 2015-03-19 DIAGNOSIS — R29898 Other symptoms and signs involving the musculoskeletal system: Secondary | ICD-10-CM | POA: Diagnosis not present

## 2015-03-19 DIAGNOSIS — M25512 Pain in left shoulder: Secondary | ICD-10-CM

## 2015-03-19 DIAGNOSIS — R269 Unspecified abnormalities of gait and mobility: Secondary | ICD-10-CM

## 2015-03-19 NOTE — Therapy (Signed)
[facility] Rehabilitation [facility] [address]  [address] [place], [place], [address] Phone: [phone]   Fax:  [phone]  Physical Therapy Treatment  Patient Details  Name: [name] [name] MRN: [id number] Date of Birth: [birth year] Referring Provider:  Teryl Lucy, MD  Encounter Date: 03/19/2015      PT End of Session - 03/19/15 1333    Visit Number 6   Number of Visits 18   Date for PT Re-Evaluation 03/31/15   PT Start Time 1317   PT Stop Time 1420   PT Time Calculation (min) 63 min      Past Medical History  Diagnosis Date  . Blood clotting tendency     right leg went to lung  . Diabetes mellitus without complication   . History of kidney stones   . Arthritis   . Left rotator cuff tear arthropathy 11/14/2014  . Movement disorder   . Hypertension   . Vision abnormalities     Past Surgical History  Procedure Laterality Date  . Total knee arthroplasty      bilat  . Rotator cuff repair      bilat  . Cataract extraction w/ intraocular lens  implant, bilateral    . Reverse shoulder arthroplasty Left 11/14/2014    Procedure: REVERSE SHOULDER ARTHROPLASTY;  Surgeon: Eulas Post, MD;  Location: [facility] OR;  Service: Orthopedics;  Laterality: Left;    There were no vitals filed for this visit.  Visit Diagnosis:  Shoulder weakness  Abnormality of gait  Shoulder stiffness, left  Pain in joint, shoulder region, left      Subjective Assessment - 03/19/15 1321    Subjective pt continues to c/o anxiety limiting his ability to attend PT (missed 2-3 sessions since last treatment due to this and missed MD f/u appointment this AM for same reason).  He states walking seems to be improving but also states is not up on feet much throughout the day.   Currently in Pain? No/denies            [facility] PT Assessment - 03/19/15 0001    PROM   PROM Assessment Site Shoulder   Right/Left Shoulder Left   Left Shoulder Flexion 82 Degrees   Left  Shoulder ABduction 79 Degrees   Left Shoulder Internal Rotation 70 Degrees  @ 60 ABD   Left Shoulder External Rotation 23 Degrees  @ 60ABD        TODAY'S TREATMENT TherEx - NuStep lvl 4, 5' (UE/LE) Supine Wand B Flexion 2x8, chest press 6x, ER AAROM 8x Standing hand on wall shoulder Flexion slide 2x8 with A from PT Staggered standing hand on counter shoulder flexion slide 10x Standing IR Yellow Tb 10x (minimal ROM) Stander ER Yellow Tb 10x (with PT A)  Neuro Sit->stand with EC 6x 8" step toe tapping 10x each (LOB x2 requiring MinA to prevent fall) Standing hip ABD AROM without UE A, mild LOB but able to self correct  Gait 12 cone gait training with SPC working to increase step ht, stride length, and appropriate use of SPC. Performed 14 laps with cones 18-24" apart                    PT Education - 03/19/15 1430    Education provided Yes   Education Details shoulder ROM HEP   Person(s) Educated Patient   Methods Explanation;Demonstration;Handout   Comprehension Verbalized understanding;Returned demonstration          PT  Short Term Goals - 02/22/15 1758    PT SHORT TERM GOAL #1   Title pt instructed in and independent with initial HEP by 03/05/15   Status On-going           PT Long Term Goals - 02/22/15 1758    PT LONG TERM GOAL #1   Title pt independent with advanced HEP as necessary for continued progress by 04/03/15   Status On-going   PT LONG TERM GOAL #2   Title L Shoulder Elevation AROM to 115 degrees or better by 04/03/15   Status On-going   PT LONG TERM GOAL #3   Title pt scores 49/56 or better on Berg assessment by 04/03/15   Status On-going   PT LONG TERM GOAL #4   Title pt able to perform all ADLs and transfers independtly by 7/5/6   Status On-going   PT LONG TERM GOAL #5   Title L Shoulder ER AROM 35 at 90 ABD by 04/03/15   Status On-going               Plan - 03/19/15 1426    Clinical Impression Statement pt has attended 6  visits in past 4 weeks (POC is 3x/wk).  He has missed several appointments due to anxiety, medication issues, and/or due to kidney stones.  His Shoulder PROM displays mild improvement but strength and function of shoulder still very poor.  Balance still impaired and pt requires great deal of VC and TC with gait training.  Gait tends to be short B stride and poor foot clearance.   PT Next Visit Plan manual and AAROM/AROM for L Shoulder, balance/gait training (gait belt and close SBA to CGA)   Consulted and Agree with Plan of Care Patient        Problem List Patient Active Problem List   Diagnosis Date Noted  . Parkinson disease 01/24/2015  . Gait disorder 01/24/2015  . Tremor 01/24/2015  . Abnormal gait 01/24/2015  . Idiopathic Parkinson's disease 01/24/2015  . Left rotator cuff tear arthropathy 11/14/2014  . Diabetes mellitus without complication 11/14/2014  . Blood clotting tendency 11/14/2014  . Malignant hypertension 11/14/2014  . Chronic diastolic heart failure 11/14/2014  . CKD (chronic kidney disease) stage 3, GFR 30-59 ml/min 11/14/2014  . Absolute anemia 03/26/2014  . Anxiety disorder 03/26/2014  . Benign essential HTN 03/26/2014  . Burning or prickling sensation 03/26/2014  . Cerumen impaction 03/26/2014  . Controlled diabetes mellitus type II without complication 03/26/2014  . Blood in the urine 03/26/2014  . Fatigue 03/26/2014  . Chalazion, suppurating 03/26/2014  . Calcium blood increased 03/26/2014  . Gonalgia 03/26/2014  . Elevated WBC count 03/26/2014  . Anemia, macrocytic 03/26/2014  . Arthritis, degenerative 03/26/2014  . Abnormal blood sugar 03/26/2014  . HLD (hyperlipidemia) 03/26/2014  . Basal cell papilloma 03/26/2014  . Arthralgia of shoulder 03/26/2014  . Rotator cuff arthropathy 03/26/2014  . Pain in the wrist 03/26/2014  . SCH (subconjunctival hemorrhage) 03/26/2014  . Limb swelling 03/26/2014  . Fast heart beat 03/26/2014  . Phlebectasia  03/26/2014    Nyia Tsao PT, OCS 03/19/2015, 2:30 PM  [facility] [address]  [address] [place], [place], [address] Phone: [phone]   Fax:  [phone]

## 2015-03-21 ENCOUNTER — Ambulatory Visit: Payer: Medicare Other | Admitting: Rehabilitation

## 2015-03-21 DIAGNOSIS — M25612 Stiffness of left shoulder, not elsewhere classified: Secondary | ICD-10-CM

## 2015-03-21 DIAGNOSIS — M25512 Pain in left shoulder: Secondary | ICD-10-CM

## 2015-03-21 DIAGNOSIS — R269 Unspecified abnormalities of gait and mobility: Secondary | ICD-10-CM

## 2015-03-21 DIAGNOSIS — R29898 Other symptoms and signs involving the musculoskeletal system: Secondary | ICD-10-CM | POA: Diagnosis not present

## 2015-03-21 NOTE — Therapy (Signed)
[facility] Outpatient Rehabilitation [facility] [address]  [address] [place], [place], [address] Phone: [phone]   Fax:  [phone]  Physical Therapy Treatment  Patient Details  Name: [name] [name] MRN: [id number] Date of Birth: 1934/08/04 Referring Provider:  Teryl Lucy, MD  Encounter Date: 03/21/2015      PT End of Session - 03/21/15 1320    Visit Number 7   Number of Visits 18   Date for PT Re-Evaluation 03/31/15   PT Start Time 1320   PT Stop Time 1400   PT Time Calculation (min) 40 min   Activity Tolerance Patient tolerated treatment well   Behavior During Therapy [facility] for tasks assessed/performed      Past Medical History  Diagnosis Date  . Blood clotting tendency     right leg went to lung  . Diabetes mellitus without complication   . History of kidney stones   . Arthritis   . Left rotator cuff tear arthropathy 11/14/2014  . Movement disorder   . Hypertension   . Vision abnormalities     Past Surgical History  Procedure Laterality Date  . Total knee arthroplasty      bilat  . Rotator cuff repair      bilat  . Cataract extraction w/ intraocular lens  implant, bilateral    . Reverse shoulder arthroplasty Left 11/14/2014    Procedure: REVERSE SHOULDER ARTHROPLASTY;  Surgeon: Eulas Post, MD;  Location: [facility] OR;  Service: Orthopedics;  Laterality: Left;    There were no vitals filed for this visit.  Visit Diagnosis:  Shoulder weakness  Abnormality of gait  Shoulder stiffness, left  Pain in joint, shoulder region, left      Subjective Assessment - 03/21/15 1324    Subjective "Not doing too good today." Feels shaky with his balance today due to his anxiety. Felt ok after last visit.    Currently in Pain? No/denies      TODAY'S TREATMENT TherEx - NuStep lvl 4, 5' (UE/LE) Seated Bil ER with yellow TB 15x (with PTA assist for motion) Seated Low Row with yellow TB 15x Seated Pball Roll Out 10x for Shoulder Flexion    Staggered Standing Hand on Counter Shoulder Flexion Slide 2x10 Staggered Standing Hand on Counter Shoulder Scaption Slide 2x10 Standing Heel Raises at Counter x20  Mini Squats at Counter x15  Neuro-  Standing on Blue Foam FT with Head turns (up/down, side/side) x8 each (SBA, no HHA) Standing on Blue Foam Alt Hip Abduction x10 (pt very hesitant with lifting Lt LE but able to complete, 1 HHA on counter/ CGA) Sit to Stand (hands on thighs) 10x       PT Short Term Goals - 02/22/15 1758    PT SHORT TERM GOAL #1   Title pt instructed in and independent with initial HEP by 03/05/15   Status On-going           PT Long Term Goals - 02/22/15 1758    PT LONG TERM GOAL #1   Title pt independent with advanced HEP as necessary for continued progress by 04/03/15   Status On-going   PT LONG TERM GOAL #2   Title L Shoulder Elevation AROM to 115 degrees or better by 04/03/15   Status On-going   PT LONG TERM GOAL #3   Title pt scores 49/56 or better on Berg assessment by 04/03/15   Status On-going   PT LONG TERM GOAL #4   Title pt able to  perform all ADLs and transfers independtly by 7/5/6   Status On-going   PT LONG TERM GOAL #5   Title L Shoulder ER AROM 35 at 90 ABD by 04/03/15   Status On-going               Plan - 03/21/15 1452    Clinical Impression Statement Continued with a mixture of shoulder and balance exercises with pt demonstrating good tolerance. Does need assistance on occation with sit to stand transfers to prevent pt falling backwards.    PT Next Visit Plan manual and AAROM/AROM for L Shoulder, balance/gait training (gait belt and close SBA to CGA)   Consulted and Agree with Plan of Care Patient        Problem List Patient Active Problem List   Diagnosis Date Noted  . Parkinson disease 01/24/2015  . Gait disorder 01/24/2015  . Tremor 01/24/2015  . Abnormal gait 01/24/2015  . Idiopathic Parkinson's disease 01/24/2015  . Left rotator cuff tear arthropathy  11/14/2014  . Diabetes mellitus without complication 11/14/2014  . Blood clotting tendency 11/14/2014  . Malignant hypertension 11/14/2014  . Chronic diastolic heart failure 11/14/2014  . CKD (chronic kidney disease) stage 3, GFR 30-59 ml/min 11/14/2014  . Absolute anemia 03/26/2014  . Anxiety disorder 03/26/2014  . Benign essential HTN 03/26/2014  . Burning or prickling sensation 03/26/2014  . Cerumen impaction 03/26/2014  . Controlled diabetes mellitus type II without complication 03/26/2014  . Blood in the urine 03/26/2014  . Fatigue 03/26/2014  . Chalazion, suppurating 03/26/2014  . Calcium blood increased 03/26/2014  . Gonalgia 03/26/2014  . Elevated WBC count 03/26/2014  . Anemia, macrocytic 03/26/2014  . Arthritis, degenerative 03/26/2014  . Abnormal blood sugar 03/26/2014  . HLD (hyperlipidemia) 03/26/2014  . Basal cell papilloma 03/26/2014  . Arthralgia of shoulder 03/26/2014  . Rotator cuff arthropathy 03/26/2014  . Pain in the wrist 03/26/2014  . SCH (subconjunctival hemorrhage) 03/26/2014  . Limb swelling 03/26/2014  . Fast heart beat 03/26/2014  . Phlebectasia 03/26/2014    Ronney Lion, PTA 03/21/2015, 3:00 PM  [facility] [address]  [address] [place], [place], [address] Phone: [phone]   Fax:  [phone]

## 2015-03-22 ENCOUNTER — Telehealth: Payer: Self-pay | Admitting: Neurology

## 2015-03-22 ENCOUNTER — Ambulatory Visit: Payer: Medicare Other | Admitting: Rehabilitation

## 2015-03-22 DIAGNOSIS — M25512 Pain in left shoulder: Secondary | ICD-10-CM

## 2015-03-22 DIAGNOSIS — M25612 Stiffness of left shoulder, not elsewhere classified: Secondary | ICD-10-CM

## 2015-03-22 DIAGNOSIS — R29898 Other symptoms and signs involving the musculoskeletal system: Secondary | ICD-10-CM

## 2015-03-22 DIAGNOSIS — R269 Unspecified abnormalities of gait and mobility: Secondary | ICD-10-CM

## 2015-03-22 NOTE — Therapy (Signed)
[facility] Outpatient Rehabilitation [facility] [address]  [address] [place], [place], [address] Phone: [phone]   Fax:  [phone]  Physical Therapy Treatment  Patient Details  Name: [name] [name] MRN: [id number] Date of Birth: [birth year] Referring Provider:  Teryl Lucy, MD  Encounter Date: 03/22/2015      PT End of Session - 03/22/15 1324    Visit Number 8   Number of Visits 18   Date for PT Re-Evaluation 03/31/15   PT Start Time 1321   PT Stop Time 1410   PT Time Calculation (min) 49 min   Activity Tolerance Patient tolerated treatment well   Behavior During Therapy [facility] for tasks assessed/performed      Past Medical History  Diagnosis Date  . Blood clotting tendency     right leg went to lung  . Diabetes mellitus without complication   . History of kidney stones   . Arthritis   . Left rotator cuff tear arthropathy 11/14/2014  . Movement disorder   . Hypertension   . Vision abnormalities     Past Surgical History  Procedure Laterality Date  . Total knee arthroplasty      bilat  . Rotator cuff repair      bilat  . Cataract extraction w/ intraocular lens  implant, bilateral    . Reverse shoulder arthroplasty Left 11/14/2014    Procedure: REVERSE SHOULDER ARTHROPLASTY;  Surgeon: Eulas Post, MD;  Location: [facility] OR;  Service: Orthopedics;  Laterality: Left;    There were no vitals filed for this visit.  Visit Diagnosis:  Shoulder weakness  Abnormality of gait  Shoulder stiffness, left  Pain in joint, shoulder region, left      Subjective Assessment - 03/22/15 1327    Subjective Still reports problems in the morning with anxiety. Denies pain or sorenss today.    Currently in Pain? No/denies      TODAY'S TREATMENT TherEx - NuStep lvl 4, 5' (UE/LE) Supine Wand B Flexion 2x10, chest press 10x, ER AAROM 10x Seated Low Row with yellow TB 15x Seated Bil ER with yellow TB 15x (with PTA assist for motion) Standing hand on wall  shoulder Flexion slides 2x8 (with PTA assist for motion) Standing circles at (approx) 60-70 degrees Flexion 10x each way (with PTA assist for motion) Seated Pball roll out Flexion 10x, Scaption 10x  Neuro- Sit to Stand 10x, with EC 5x Marching x10 with 1 HHA, x10 with ) HHA Toe Tapping to 6" Step 15x (no LOB today) Standing on Blue Foam Alt Hip Abduction 10x with 2 HHA on chair, then 10x with 1 HHA on chair       PT Short Term Goals - 02/22/15 1758    PT SHORT TERM GOAL #1   Title pt instructed in and independent with initial HEP by 03/05/15   Status On-going           PT Long Term Goals - 02/22/15 1758    PT LONG TERM GOAL #1   Title pt independent with advanced HEP as necessary for continued progress by 04/03/15   Status On-going   PT LONG TERM GOAL #2   Title L Shoulder Elevation AROM to 115 degrees or better by 04/03/15   Status On-going   PT LONG TERM GOAL #3   Title pt scores 49/56 or better on Berg assessment by 04/03/15   Status On-going   PT LONG TERM GOAL #4   Title pt able to perform all  ADLs and transfers independtly by 7/5/6   Status On-going   PT LONG TERM GOAL #5   Title L Shoulder ER AROM 35 at 90 ABD by 04/03/15   Status On-going               Plan - 03/22/15 1412    Clinical Impression Statement Good tolerance to exercises today with pt reporting he was a little tired in the middle of the treatment but able to continue. Still having a difficult time and requiring assist to move his Lt UE.    PT Next Visit Plan manual and AAROM/AROM for L Shoulder, balance/gait training (gait belt and close SBA to CGA)   Consulted and Agree with Plan of Care Patient        Problem List Patient Active Problem List   Diagnosis Date Noted  . Parkinson disease 01/24/2015  . Gait disorder 01/24/2015  . Tremor 01/24/2015  . Abnormal gait 01/24/2015  . Idiopathic Parkinson's disease 01/24/2015  . Left rotator cuff tear arthropathy 11/14/2014  . Diabetes mellitus  without complication 11/14/2014  . Blood clotting tendency 11/14/2014  . Malignant hypertension 11/14/2014  . Chronic diastolic heart failure 11/14/2014  . CKD (chronic kidney disease) stage 3, GFR 30-59 ml/min 11/14/2014  . Absolute anemia 03/26/2014  . Anxiety disorder 03/26/2014  . Benign essential HTN 03/26/2014  . Burning or prickling sensation 03/26/2014  . Cerumen impaction 03/26/2014  . Controlled diabetes mellitus type II without complication 03/26/2014  . Blood in the urine 03/26/2014  . Fatigue 03/26/2014  . Chalazion, suppurating 03/26/2014  . Calcium blood increased 03/26/2014  . Gonalgia 03/26/2014  . Elevated WBC count 03/26/2014  . Anemia, macrocytic 03/26/2014  . Arthritis, degenerative 03/26/2014  . Abnormal blood sugar 03/26/2014  . HLD (hyperlipidemia) 03/26/2014  . Basal cell papilloma 03/26/2014  . Arthralgia of shoulder 03/26/2014  . Rotator cuff arthropathy 03/26/2014  . Pain in the wrist 03/26/2014  . SCH (subconjunctival hemorrhage) 03/26/2014  . Limb swelling 03/26/2014  . Fast heart beat 03/26/2014  . Phlebectasia 03/26/2014    Ronney Lion, PTA 03/22/2015, 2:15 PM  [facility] [address]  [address] [place], [place], [address] Phone: [phone]   Fax:  [phone]

## 2015-03-22 NOTE — Telephone Encounter (Signed)
I have spoken with [name] [name] and reinforced that Dr. Epimenio Foot has not been successful in treating anxiety, and further tx. for this should come from a psychiatrist--I have offered referral, but she has declined at this time./fim

## 2015-03-22 NOTE — Telephone Encounter (Signed)
Patients wife called and requested to Faith RN regarding the patients medication Rx. busPIRone (BUSPAR) 10 MG tablet. She would like to know if they can increase the dosage because he is experiencing anxiety. Please call and advise.

## 2015-03-26 ENCOUNTER — Ambulatory Visit: Payer: Medicare Other | Admitting: Physical Therapy

## 2015-03-26 DIAGNOSIS — R29898 Other symptoms and signs involving the musculoskeletal system: Secondary | ICD-10-CM | POA: Diagnosis not present

## 2015-03-26 DIAGNOSIS — R531 Weakness: Secondary | ICD-10-CM

## 2015-03-26 DIAGNOSIS — R269 Unspecified abnormalities of gait and mobility: Secondary | ICD-10-CM

## 2015-03-26 NOTE — Therapy (Signed)
[facility] [address]  [address] [place], [place], [address] Phone: [phone]   Fax:  [phone]  Physical Therapy Treatment  Patient Details  Name: [name] JabsRonald L Adel MRN: [id number] Date of Birth: 12/04/1933 Referring Provider:  Teryl LucyLandau, Joshua, MD  Encounter Date: 03/26/2015      PT End of Session - 03/26/15 1324    Visit Number 9   Number of Visits 18   Date for PT Re-Evaluation 04/20/15   PT Start Time 1319   PT Stop Time 1410   PT Time Calculation (min) 51 min      Past Medical History  Diagnosis Date  . Blood clotting tendency     right leg went to lung  . Diabetes mellitus without complication   . History of kidney stones   . Arthritis   . Left rotator cuff tear arthropathy 11/14/2014  . Movement disorder   . Hypertension   . Vision abnormalities     Past Surgical History  Procedure Laterality Date  . Total knee arthroplasty      bilat  . Rotator cuff repair      bilat  . Cataract extraction w/ intraocular lens  implant, bilateral    . Reverse shoulder arthroplasty Left 11/14/2014    Procedure: REVERSE SHOULDER ARTHROPLASTY;  Surgeon: Eulas PostJoshua P Landau, MD;  Location: [facility] OR;  Service: Orthopedics;  Laterality: Left;    There were no vitals filed for this visit.  Visit Diagnosis:  Weakness generalized  Shoulder weakness  Abnormality of gait     TODAY'S TREATMENT NuStep lvl 4, 5'  TherEx - Partial diagonal curl-up 5x each Hooklying March 6x each Hooklying march with contra hand to knee 6x Partial curl-up 5x (minimal ROM) Reverse Curl-up 8x (fair)  TherAct - bed mobility training: rolling R and L (is able to achieve side-lying but requires MinA) Supine scoot training with Henreitta LeberBridges to R and L (is able to move LE's but is unable to follow upper trunk due to too weak in scapular muscles and trunk muscles)  TherEx - hooklying scap retraction isometric 5x5" Low Row 20# 3x8 Staggered Standing one  arm row Green TB 6x each, Red TB 10x each (focus on scapular retraction and upright posture) Staggered Standing R UE Punch with trunk diagonal contraction Green TB 6x, Red TB 10x Staggered Standing L UE Punch RROM against PT resistance at hand and front of pt's shoulder 2x8                PT Education - 03/26/15 1527    Education provided Yes   Education Details bed mobility training and staggered standing one-arm row   Person(s) Educated Patient;Spouse   Methods Explanation;Demonstration   Comprehension Verbalized understanding;Returned demonstration          PT Short Term Goals - 02/22/15 1758    PT SHORT TERM GOAL #1   Title pt instructed in and independent with initial HEP by 03/05/15   Status On-going           PT Long Term Goals - 02/22/15 1758    PT LONG TERM GOAL #1   Title pt independent with advanced HEP as necessary for continued progress by 04/03/15   Status On-going   PT LONG TERM GOAL #2   Title L Shoulder Elevation AROM to 115 degrees or better by 04/03/15   Status On-going   PT LONG TERM GOAL #3   Title pt scores 49/56 or better on Berg  assessment by 04/03/15   Status On-going   PT LONG TERM GOAL #4   Title pt able to perform all ADLs and transfers independtly by 7/5/6   Status On-going   PT LONG TERM GOAL #5   Title L Shoulder ER AROM 35 at 90 ABD by 04/03/15   Status On-going               Plan - 03/26/15 1522    Clinical Impression Statement Mr. Prehn has been attending PT for 5 weeks but has only been able to attend 9 of 15 scheduled appointments to date.  He cancelled several due to issues with anxiety, others due to inability to get out of bed in time, and one or two due to kidney stones.  He displays poor overall level of function and due to this I have decided to spend less time focusing on L Shoulder ROM and rather work on progressing overall level of function.  Pt is currently unable to perform bed mobility tasks (rolling, supine<->sit)  independently due to UE and trunk weakness which is likely in large part due to Parkinson's Disease.  We are therefore shifting more focus to these type tasks as well as gait and balance to increase pt's level of safety as well as the level of safety of his spouse who currently has to assist him with great deal of ADLs.   Pt will benefit from skilled therapeutic intervention in order to improve on the following deficits Decreased range of motion;Decreased balance;Decreased mobility;Decreased strength;Difficulty walking;Postural dysfunction;Improper body mechanics;Pain;Impaired UE functional use;Abnormal gait;Decreased coordination   Rehab Potential Good   PT Treatment/Interventions ADLs/Self Care Home Management;Cryotherapy;Retail banker;Therapeutic activities;Therapeutic exercise;Balance training;Patient/family education;Manual techniques;Vasopneumatic Device;Functional mobility training;Neuromuscular re-education   PT Next Visit Plan bed mobility training, trunk and scapular strengthening, balance/gait training (gait belt and close SBA to CGA)   Consulted and Agree with Plan of Care Patient;Family member/caregiver   Family Member Consulted spouse        Problem List Patient Active Problem List   Diagnosis Date Noted  . Parkinson disease 01/24/2015  . Gait disorder 01/24/2015  . Tremor 01/24/2015  . Abnormal gait 01/24/2015  . Idiopathic Parkinson's disease 01/24/2015  . Left rotator cuff tear arthropathy 11/14/2014  . Diabetes mellitus without complication 11/14/2014  . Blood clotting tendency 11/14/2014  . Malignant hypertension 11/14/2014  . Chronic diastolic heart failure 11/14/2014  . CKD (chronic kidney disease) stage 3, GFR 30-59 ml/min 11/14/2014  . Absolute anemia 03/26/2014  . Anxiety disorder 03/26/2014  . Benign essential HTN 03/26/2014  . Burning or prickling sensation 03/26/2014  . Cerumen impaction 03/26/2014  . Controlled diabetes  mellitus type II without complication 03/26/2014  . Blood in the urine 03/26/2014  . Fatigue 03/26/2014  . Chalazion, suppurating 03/26/2014  . Calcium blood increased 03/26/2014  . Gonalgia 03/26/2014  . Elevated WBC count 03/26/2014  . Anemia, macrocytic 03/26/2014  . Arthritis, degenerative 03/26/2014  . Abnormal blood sugar 03/26/2014  . HLD (hyperlipidemia) 03/26/2014  . Basal cell papilloma 03/26/2014  . Arthralgia of shoulder 03/26/2014  . Rotator cuff arthropathy 03/26/2014  . Pain in the wrist 03/26/2014  . SCH (subconjunctival hemorrhage) 03/26/2014  . Limb swelling 03/26/2014  . Fast heart beat 03/26/2014  . Phlebectasia 03/26/2014    Laurier Jasperson PT, OCS 03/26/2015, 3:37 PM  [facility] [address]  [address] [place], [place], [address] Phone: [phone]   Fax:  [phone]

## 2015-03-28 ENCOUNTER — Ambulatory Visit: Payer: Medicare Other | Admitting: Rehabilitation

## 2015-03-28 DIAGNOSIS — R269 Unspecified abnormalities of gait and mobility: Secondary | ICD-10-CM

## 2015-03-28 DIAGNOSIS — R29898 Other symptoms and signs involving the musculoskeletal system: Secondary | ICD-10-CM | POA: Diagnosis not present

## 2015-03-28 DIAGNOSIS — M25612 Stiffness of left shoulder, not elsewhere classified: Secondary | ICD-10-CM

## 2015-03-28 DIAGNOSIS — R531 Weakness: Secondary | ICD-10-CM

## 2015-03-28 DIAGNOSIS — M25512 Pain in left shoulder: Secondary | ICD-10-CM

## 2015-03-28 NOTE — Therapy (Signed)
[facility] Outpatient Rehabilitation [facility] [address]  [address] [place], [place], [address] Phone: [phone]   Fax:  [phone]  Physical Therapy Treatment  Patient Details  Name: [name] [name] MRN: [id number] Date of Birth: [birth year] Referring Provider:  Teryl Lucy, MD  Encounter Date: 03/28/2015      PT End of Session - 03/28/15 1322    Visit Number 10   Number of Visits 18   Date for PT Re-Evaluation 04/20/15   PT Start Time 1319   PT Stop Time 1350   PT Time Calculation (min) 31 min   Activity Tolerance Patient tolerated treatment well   Behavior During Therapy [facility] for tasks assessed/performed      Past Medical History  Diagnosis Date  . Blood clotting tendency     right leg went to lung  . Diabetes mellitus without complication   . History of kidney stones   . Arthritis   . Left rotator cuff tear arthropathy 11/14/2014  . Movement disorder   . Hypertension   . Vision abnormalities     Past Surgical History  Procedure Laterality Date  . Total knee arthroplasty      bilat  . Rotator cuff repair      bilat  . Cataract extraction w/ intraocular lens  implant, bilateral    . Reverse shoulder arthroplasty Left 11/14/2014    Procedure: REVERSE SHOULDER ARTHROPLASTY;  Surgeon: Eulas Post, MD;  Location: [facility] OR;  Service: Orthopedics;  Laterality: Left;    There were no vitals filed for this visit.  Visit Diagnosis:  Shoulder weakness  Weakness generalized  Abnormality of gait  Shoulder stiffness, left  Pain in joint, shoulder region, left      Subjective Assessment - 03/28/15 1322    Subjective Reports he feels weak and shaky today. Says his whole body is just weak and almost didn't come today but he wanted to try.    Currently in Pain? No/denies      TODAY'S TREATMENT TherEx -NuStep lvl 4, 5'  Seated Bil ER with Red TB 10x (PTA assist for Lt) Seated Row Red TB 2x10 Hooklying March 10x Hooklying March with  contra hand to knee 8x Partial curl-up 8x  Hooklying Scap Retraction Iso with Bil ER 8x5"  Bridges 10x Seated Hip Abduction with Red TB 10x Standing Heel/Toe Raises 10x (with 2 HHA on chair)  Neuro- Sit to Stand 10x, with EC 5x (SBA) Marching x10 (with 1 HHA, CGA) Toe Tapping to 6" Step 10x (1 HHA on chair, CGA)  FOTO score: 65% limitation (CL)       PT Short Term Goals - 02/22/15 1758    PT SHORT TERM GOAL #1   Title pt instructed in and independent with initial HEP by 03/05/15   Status On-going           PT Long Term Goals - 02/22/15 1758    PT LONG TERM GOAL #1   Title pt independent with advanced HEP as necessary for continued progress by 04/03/15   Status On-going   PT LONG TERM GOAL #2   Title L Shoulder Elevation AROM to 115 degrees or better by 04/03/15   Status On-going   PT LONG TERM GOAL #3   Title pt scores 49/56 or better on Berg assessment by 04/03/15   Status On-going   PT LONG TERM GOAL #4   Title pt able to perform all ADLs and transfers independtly by 7/5/6   Status  On-going   PT LONG TERM GOAL #5   Title L Shoulder ER AROM 35 at 90 ABD by 04/03/15   Status On-going               Plan - 03/28/15 1357    Clinical Impression Statement Stopped treatment early due to pt quickly fatiguing and difficulty with standing exericses. Pt also wanted to stop early due to not feel like he could continue. We will continue to pt tolerance and keep focus on core and scapular stability, and transfers.    PT Next Visit Plan bed mobility training, trunk and scapular strengthening, balance/gait training (gait belt and close SBA to CGA)   Consulted and Agree with Plan of Care Patient;Family member/caregiver        Problem List Patient Active Problem List   Diagnosis Date Noted  . Parkinson disease 01/24/2015  . Gait disorder 01/24/2015  . Tremor 01/24/2015  . Abnormal gait 01/24/2015  . Idiopathic Parkinson's disease 01/24/2015  . Left rotator cuff tear  arthropathy 11/14/2014  . Diabetes mellitus without complication 11/14/2014  . Blood clotting tendency 11/14/2014  . Malignant hypertension 11/14/2014  . Chronic diastolic heart failure 11/14/2014  . CKD (chronic kidney disease) stage 3, GFR 30-59 ml/min 11/14/2014  . Absolute anemia 03/26/2014  . Anxiety disorder 03/26/2014  . Benign essential HTN 03/26/2014  . Burning or prickling sensation 03/26/2014  . Cerumen impaction 03/26/2014  . Controlled diabetes mellitus type II without complication 03/26/2014  . Blood in the urine 03/26/2014  . Fatigue 03/26/2014  . Chalazion, suppurating 03/26/2014  . Calcium blood increased 03/26/2014  . Gonalgia 03/26/2014  . Elevated WBC count 03/26/2014  . Anemia, macrocytic 03/26/2014  . Arthritis, degenerative 03/26/2014  . Abnormal blood sugar 03/26/2014  . HLD (hyperlipidemia) 03/26/2014  . Basal cell papilloma 03/26/2014  . Arthralgia of shoulder 03/26/2014  . Rotator cuff arthropathy 03/26/2014  . Pain in the wrist 03/26/2014  . SCH (subconjunctival hemorrhage) 03/26/2014  . Limb swelling 03/26/2014  . Fast heart beat 03/26/2014  . Phlebectasia 03/26/2014    Ronney LionShelby J Jakari Sada, PTA 03/28/2015, 2:01 PM  Juan Quamalph Hall PT, OCS 03/29/2015 7:19 AM   [facility] [address]  [address] [place], [place], [address] Phone: [phone]   Fax:  [phone]

## 2015-03-29 ENCOUNTER — Ambulatory Visit: Payer: Medicare Other | Admitting: Physical Therapy

## 2015-03-29 DIAGNOSIS — R269 Unspecified abnormalities of gait and mobility: Secondary | ICD-10-CM

## 2015-03-29 DIAGNOSIS — M25512 Pain in left shoulder: Secondary | ICD-10-CM

## 2015-03-29 DIAGNOSIS — R531 Weakness: Secondary | ICD-10-CM

## 2015-03-29 DIAGNOSIS — M25612 Stiffness of left shoulder, not elsewhere classified: Secondary | ICD-10-CM

## 2015-03-29 DIAGNOSIS — R29898 Other symptoms and signs involving the musculoskeletal system: Secondary | ICD-10-CM

## 2015-03-29 NOTE — Therapy (Signed)
[facility] Outpatient Rehabilitation [facility] [address]  [address] [place], [place], [address] Phone: [phone]   Fax:  [phone]  Physical Therapy Treatment  Patient Details  Name: [name] [name] MRN: [id number] Date of Birth: September 09, 1934 Referring Provider:  Teryl Lucy, MD  Encounter Date: 03/29/2015      PT End of Session - 03/29/15 1325    Visit Number 11   Number of Visits 18   Date for PT Re-Evaluation 04/20/15   PT Start Time 1317   PT Stop Time 1402   PT Time Calculation (min) 45 min      Past Medical History  Diagnosis Date  . Blood clotting tendency     right leg went to lung  . Diabetes mellitus without complication   . History of kidney stones   . Arthritis   . Left rotator cuff tear arthropathy 11/14/2014  . Movement disorder   . Hypertension   . Vision abnormalities     Past Surgical History  Procedure Laterality Date  . Total knee arthroplasty      bilat  . Rotator cuff repair      bilat  . Cataract extraction w/ intraocular lens  implant, bilateral    . Reverse shoulder arthroplasty Left 11/14/2014    Procedure: REVERSE SHOULDER ARTHROPLASTY;  Surgeon: Eulas Post, MD;  Location: [facility] OR;  Service: Orthopedics;  Laterality: Left;    There were no vitals filed for this visit.  Visit Diagnosis:  Weakness generalized  Shoulder weakness  Abnormality of gait  Shoulder stiffness, left  Pain in joint, shoulder region, left      Subjective Assessment - 03/29/15 1318    Subjective States feeling a little better today with regard to level of energy.  Saw MD yesterday regarding shoulder surgery and pt states MD was pleased with current shoulder status.  Pt states he's encouraged by this.  Pt states he feels as though he's getting into bed easier lately.   Currently in Pain? No/denies       TODAY'S TREATMENT: TherEx - Seated Trunk Isometrics Flexion, Extension and B rotation 5x5" each (requires VC each time to  correctly perform rotation resistance) Seated Low Row Double Green TB with slight trunk rotation 2x10 each Seated Punch Diagonal Double Red TB with slight trunk rotation 2x10 each Bridge 10x Hooklying Partial Curl-up sliding hands on table towards heels 10x (difficuly, minimal ROM, c/o some neck pain with this) Single LE on ball (55cm) hip flexion with contra UE pressing into knee for trunk diagonal isometric 8x3" each B FOB (55cm) LTR 60" (minimal ROM but no c/o pain) B FOB (55cm) tuck 20x Squat at edge of plinth 5x  TherAct - Bed Mobility training rolling supine<->R side-lying and R side-lying<->sit.  3 trials with 3rd trial only requiring 1-2 finger assist to achieve seated positioning.                            PT Short Term Goals - 03/29/15 1405    PT SHORT TERM GOAL #1   Title pt instructed in and independent with initial HEP   Status Achieved           PT Long Term Goals - 03/29/15 1406    PT LONG TERM GOAL #1   Title pt independent with advanced HEP as necessary for continued progress by 04/20/15   Status Revised   PT LONG TERM GOAL #2  Title L Shoulder Elevation AROM to 90 degrees or better by 04/20/15   Status Revised   PT LONG TERM GOAL #3   Title pt scores 49/56 or better on Berg assessment by 04/20/15   Status Revised   PT LONG TERM GOAL #4   Title pt able to perform all ADLs and transfers independently by 7/22/6   Status Revised   PT LONG TERM GOAL #5   Title L Shoulder ER AROM 35 at 90 ABD by 04/20/15   Status Revised               Plan - 03/29/15 1407    Clinical Impression Statement Pt with best performance to date with bed mobility training today only requiring light Min A.  No gait training today due to amount of time spent with trunk strengthening and bed mobility training.  Pt tells me today that he is going on 1 month vacation beginning 1Aug2016 so really need his level of independence and safety to increase significantly by  then.   PT Next Visit Plan bed mobility training, trunk and scapular strengthening, balance/gait training (gait belt and close SBA to CGA)   Consulted and Agree with Plan of Care Patient        Problem List Patient Active Problem List   Diagnosis Date Noted  . Parkinson disease 01/24/2015  . Gait disorder 01/24/2015  . Tremor 01/24/2015  . Abnormal gait 01/24/2015  . Idiopathic Parkinson's disease 01/24/2015  . Left rotator cuff tear arthropathy 11/14/2014  . Diabetes mellitus without complication 11/14/2014  . Blood clotting tendency 11/14/2014  . Malignant hypertension 11/14/2014  . Chronic diastolic heart failure 11/14/2014  . CKD (chronic kidney disease) stage 3, GFR 30-59 ml/min 11/14/2014  . Absolute anemia 03/26/2014  . Anxiety disorder 03/26/2014  . Benign essential HTN 03/26/2014  . Burning or prickling sensation 03/26/2014  . Cerumen impaction 03/26/2014  . Controlled diabetes mellitus type II without complication 03/26/2014  . Blood in the urine 03/26/2014  . Fatigue 03/26/2014  . Chalazion, suppurating 03/26/2014  . Calcium blood increased 03/26/2014  . Gonalgia 03/26/2014  . Elevated WBC count 03/26/2014  . Anemia, macrocytic 03/26/2014  . Arthritis, degenerative 03/26/2014  . Abnormal blood sugar 03/26/2014  . HLD (hyperlipidemia) 03/26/2014  . Basal cell papilloma 03/26/2014  . Arthralgia of shoulder 03/26/2014  . Rotator cuff arthropathy 03/26/2014  . Pain in the wrist 03/26/2014  . SCH (subconjunctival hemorrhage) 03/26/2014  . Limb swelling 03/26/2014  . Fast heart beat 03/26/2014  . Phlebectasia 03/26/2014    Aerilyn Slee PT, OCS 03/29/2015, 2:11 PM  [facility] [address]  [address] [place], [place], [address] Phone: [phone]   Fax:  [phone]

## 2015-04-03 ENCOUNTER — Ambulatory Visit: Payer: Medicare Other | Attending: Orthopedic Surgery | Admitting: Physical Therapy

## 2015-04-03 DIAGNOSIS — R269 Unspecified abnormalities of gait and mobility: Secondary | ICD-10-CM | POA: Insufficient documentation

## 2015-04-03 DIAGNOSIS — M25512 Pain in left shoulder: Secondary | ICD-10-CM | POA: Diagnosis present

## 2015-04-03 DIAGNOSIS — R531 Weakness: Secondary | ICD-10-CM

## 2015-04-03 DIAGNOSIS — R29898 Other symptoms and signs involving the musculoskeletal system: Secondary | ICD-10-CM | POA: Insufficient documentation

## 2015-04-03 DIAGNOSIS — M25612 Stiffness of left shoulder, not elsewhere classified: Secondary | ICD-10-CM | POA: Diagnosis present

## 2015-04-03 NOTE — Therapy (Signed)
[facility] Outpatient Rehabilitation [facility] [address]  [address] [place], [place], [address] Phone: [phone]   Fax:  [phone]  Physical Therapy Treatment  Patient Details  Name: [name] [name] MRN: [id number] Date of Birth: [birth year] Referring Provider:  Teryl Lucy, MD  Encounter Date: 04/03/2015      PT End of Session - 04/03/15 1402    Visit Number 12   Number of Visits 18   Date for PT Re-Evaluation 04/20/15   PT Start Time 1358   PT Stop Time 1442   PT Time Calculation (min) 44 min   Activity Tolerance Patient tolerated treatment well   Behavior During Therapy [facility] for tasks assessed/performed      Past Medical History  Diagnosis Date  . Blood clotting tendency     right leg went to lung  . Diabetes mellitus without complication   . History of kidney stones   . Arthritis   . Left rotator cuff tear arthropathy 11/14/2014  . Movement disorder   . Hypertension   . Vision abnormalities     Past Surgical History  Procedure Laterality Date  . Total knee arthroplasty      bilat  . Rotator cuff repair      bilat  . Cataract extraction w/ intraocular lens  implant, bilateral    . Reverse shoulder arthroplasty Left 11/14/2014    Procedure: REVERSE SHOULDER ARTHROPLASTY;  Surgeon: Eulas Post, MD;  Location: [facility] OR;  Service: Orthopedics;  Laterality: Left;    There were no vitals filed for this visit.  Visit Diagnosis:  Weakness generalized  Shoulder weakness  Abnormality of gait  Shoulder stiffness, left  Pain in joint, shoulder region, left      Subjective Assessment - 04/03/15 1408    Subjective Patient c/o "wobbly" (weak) in knees more than usual today.   Currently in Pain? No/denies      TODAY'S TREATMENT TherEx - NuStep lvl 5, 5'  Seated Row Red TB 2x10 Hooklying LTR 10x Hooklying March 10x Hooklying March with contra hand to knee 10x  Bridges 10x Seated Hip Abduction with Red TB 2x10 Seated LAQ with 3#  cuff wt 10x bilateral Standing Heel/Toe Raises 10x (with 2 HHA on chair)  Neuro - Sit to Stand 10x, with EC 5x (SBA)  Partial stand to sit with return to stand before full weight acceptance in sitting with mat table seat heat elevated 2" using foam mat (mini-squat) 10x Marching x10 (with 1 HHA, CGA) Toe Tapping to 6" Step 10x alternating feet (1 HHA on chair, SBA, cues for )  Gait Training - Encouraged increased foot clearance and step length using masking tape targets at 16" intervals. Good step length and foot clearance observed using targets, but limited carryover observed when targets no longer present.         PT Education - 04/03/15 1819    Education provided Yes   Education Details Normalized gait pattern   Person(s) Educated Patient   Methods Explanation;Demonstration;Verbal cues   Comprehension Verbalized understanding;Returned demonstration;Need further instruction          PT Short Term Goals - 03/29/15 1405    PT SHORT TERM GOAL #1   Title pt instructed in and independent with initial HEP   Status Achieved           PT Long Term Goals - 03/29/15 1406    PT LONG TERM GOAL #1   Title pt independent with advanced HEP as necessary  for continued progress by 04/20/15   Status Revised   PT LONG TERM GOAL #2   Title L Shoulder Elevation AROM to 90 degrees or better by 04/20/15   Status Revised   PT LONG TERM GOAL #3   Title pt scores 49/56 or better on Berg assessment by 04/20/15   Status Revised   PT LONG TERM GOAL #4   Title pt able to perform all ADLs and transfers independently by 7/22/6   Status Revised   PT LONG TERM GOAL #5   Title L Shoulder ER AROM 35 at 90 ABD by 04/20/15   Status Revised               Plan - 04/03/15 1821    Clinical Impression Statement Increased instability with gait secondary to c/o knees "wobbly" therefore focus on LE strengthening and cueing for normalized gait pattern.   Pt will benefit from skilled therapeutic  intervention in order to improve on the following deficits Decreased range of motion;Decreased balance;Decreased mobility;Decreased strength;Difficulty walking;Postural dysfunction;Improper body mechanics;Pain;Impaired UE functional use;Abnormal gait;Decreased coordination;Decreased activity tolerance;Decreased endurance;Decreased knowledge of use of DME   Rehab Potential Good   PT Next Visit Plan bed mobility training, trunk and scapular strengthening, LE strengthening, balance/gait training (gait belt and close SBA to CGA)   Consulted and Agree with Plan of Care Patient        Problem List Patient Active Problem List   Diagnosis Date Noted  . Parkinson disease 01/24/2015  . Gait disorder 01/24/2015  . Tremor 01/24/2015  . Abnormal gait 01/24/2015  . Idiopathic Parkinson's disease 01/24/2015  . Left rotator cuff tear arthropathy 11/14/2014  . Diabetes mellitus without complication 11/14/2014  . Blood clotting tendency 11/14/2014  . Malignant hypertension 11/14/2014  . Chronic diastolic heart failure 11/14/2014  . CKD (chronic kidney disease) stage 3, GFR 30-59 ml/min 11/14/2014  . Absolute anemia 03/26/2014  . Anxiety disorder 03/26/2014  . Benign essential HTN 03/26/2014  . Burning or prickling sensation 03/26/2014  . Cerumen impaction 03/26/2014  . Controlled diabetes mellitus type II without complication 03/26/2014  . Blood in the urine 03/26/2014  . Fatigue 03/26/2014  . Chalazion, suppurating 03/26/2014  . Calcium blood increased 03/26/2014  . Gonalgia 03/26/2014  . Elevated WBC count 03/26/2014  . Anemia, macrocytic 03/26/2014  . Arthritis, degenerative 03/26/2014  . Abnormal blood sugar 03/26/2014  . HLD (hyperlipidemia) 03/26/2014  . Basal cell papilloma 03/26/2014  . Arthralgia of shoulder 03/26/2014  . Rotator cuff arthropathy 03/26/2014  . Pain in the wrist 03/26/2014  . SCH (subconjunctival hemorrhage) 03/26/2014  . Limb swelling 03/26/2014  . Fast heart beat  03/26/2014  . Phlebectasia 03/26/2014    Marry GuanJoAnne M. Kendalyn Cranfield, PT, MPT 04/03/2015, 6:30 PM  [facility] [address]  [address] [place], [place], [address] Phone: [phone]   Fax:  [phone]

## 2015-04-05 ENCOUNTER — Ambulatory Visit: Payer: Medicare Other | Admitting: Physical Therapy

## 2015-04-05 DIAGNOSIS — R29898 Other symptoms and signs involving the musculoskeletal system: Secondary | ICD-10-CM

## 2015-04-05 DIAGNOSIS — R531 Weakness: Secondary | ICD-10-CM

## 2015-04-05 DIAGNOSIS — R269 Unspecified abnormalities of gait and mobility: Secondary | ICD-10-CM

## 2015-04-05 DIAGNOSIS — M25612 Stiffness of left shoulder, not elsewhere classified: Secondary | ICD-10-CM

## 2015-04-05 DIAGNOSIS — M25512 Pain in left shoulder: Secondary | ICD-10-CM

## 2015-04-05 NOTE — Therapy (Signed)
[facility] [address]  [address] [place], [place], [address] Phone: [phone]   Fax:  [phone]  Physical Therapy Treatment  Patient Details  Name: [name] [name] MRN: [id number] Date of Birth: 11/22/1933 Referring Provider:  Teryl LucyLandau, Joshua, MD  Encounter Date: 04/05/2015      PT End of Session - 04/05/15 1320    Visit Number 13   Number of Visits 18   Date for PT Re-Evaluation 04/20/15   PT Start Time 1314   PT Stop Time 1402   PT Time Calculation (min) 48 min      Past Medical History  Diagnosis Date  . Blood clotting tendency     right leg went to lung  . Diabetes mellitus without complication   . History of kidney stones   . Arthritis   . Left rotator cuff tear arthropathy 11/14/2014  . Movement disorder   . Hypertension   . Vision abnormalities     Past Surgical History  Procedure Laterality Date  . Total knee arthroplasty      bilat  . Rotator cuff repair      bilat  . Cataract extraction w/ intraocular lens  implant, bilateral    . Reverse shoulder arthroplasty Left 11/14/2014    Procedure: REVERSE SHOULDER ARTHROPLASTY;  Surgeon: Eulas PostJoshua P Landau, MD;  Location: [facility] OR;  Service: Orthopedics;  Laterality: Left;    There were no vitals filed for this visit.  Visit Diagnosis:  Weakness generalized  Shoulder weakness  Abnormality of gait  Shoulder stiffness, left  Pain in joint, shoulder region, left      Subjective Assessment - 04/05/15 1320    Subjective Patient reports the MD changed (increased?) his Sinemet again yesterday, but unable to recall what exactly the changes were. Reports increasing ease of getting into bed, but still requiring wife's assistance to get back out of bed.   Currently in Pain? No/denies        TODAY'S TREATMENT  TherEx - NuStep lvl 5, 5'  Bridges 2x10 (2nd set with small ball between knees) Hooklying LTR holding small ball between knees 15x Hooklying March  10x Hooklying March with contra hand to knee 10x  Seated LAQ with 3# cuff wt 10x bilateral  TherAct - Targeted segmental/log rolling to right before attempting to rise to sitting, then utilizing right UE pushing initially with elbow then extending UE to achieve full upright sitting posture. Patient able to perform supine to sit transition without PT assistance. Educated wife on technique and appropriate cueing for in/out bed at home to decrease need for wife's assistance with bed mobility. Wife questioned how to assist patient with achieving straighter position in bed when transitioning sit to supine, therefore instructed patient and wife in reverse of supine to sit, initiating transition with sit to sidelying, then rolling to back.  Neuro - Partial stand to sit (mini-squat) with return to stand before full weight acceptance in sitting with mat table seat heat elevated 2" using foam mat 10x, 1" foam 10x Side-stepping with PT HHA  Gait Training - Encouraged increased foot clearance and step length using masking tape targets at 16" intervals. Provided cues for good heel strike and heel-toe progression to reduce foot drag and associated tripping hazard.        PT Education - 04/05/15 1429    Education provided Yes   Education Details Bed mobility technique utilizing hooklying logroll and sidelying to sit to decrease need for wife's assistance  Person(s) Educated Patient;Spouse   Methods Explanation;Demonstration;Verbal cues   Comprehension Verbalized understanding;Returned demonstration;Verbal cues required;Need further instruction          PT Short Term Goals - 03/29/15 1405    PT SHORT TERM GOAL #1   Title pt instructed in and independent with initial HEP   Status Achieved           PT Long Term Goals - 03/29/15 1406    PT LONG TERM GOAL #1   Title pt independent with advanced HEP as necessary for continued progress by 04/20/15   Status Revised   PT LONG TERM GOAL #2   Title  L Shoulder Elevation AROM to 90 degrees or better by 04/20/15   Status Revised   PT LONG TERM GOAL #3   Title pt scores 49/56 or better on Berg assessment by 04/20/15   Status Revised   PT LONG TERM GOAL #4   Title pt able to perform all ADLs and transfers independently by 7/22/6   Status Revised   PT LONG TERM GOAL #5   Title L Shoulder ER AROM 35 at 90 ABD by 04/20/15   Status Revised               Plan - 04/05/15 1432    Clinical Impression Statement Patient continuing to report need for wife's assistance with in/out bed, therefore focused on bed mobility technique with patient able to demonstrate ability to complete transition supine to/from sit with need for cues only after practice during therapy session. Education provided to wife for appropriate technique and cueing to encourage carryover at home, with patient and wife verbalizing understanding. Gait pattern improving with visual/verabl cues for increased step length, foot clearance and heel strike but carryover remains limited without cues.   PT Next Visit Plan bed mobility training, LE strengthening, balance and gait training    Consulted and Agree with Plan of Care Patient;Family member/caregiver   Family Member Consulted Wife/spouse        Problem List Patient Active Problem List   Diagnosis Date Noted  . Parkinson disease 01/24/2015  . Gait disorder 01/24/2015  . Tremor 01/24/2015  . Abnormal gait 01/24/2015  . Idiopathic Parkinson's disease 01/24/2015  . Left rotator cuff tear arthropathy 11/14/2014  . Diabetes mellitus without complication 11/14/2014  . Blood clotting tendency 11/14/2014  . Malignant hypertension 11/14/2014  . Chronic diastolic heart failure 11/14/2014  . CKD (chronic kidney disease) stage 3, GFR 30-59 ml/min 11/14/2014  . Absolute anemia 03/26/2014  . Anxiety disorder 03/26/2014  . Benign essential HTN 03/26/2014  . Burning or prickling sensation 03/26/2014  . Cerumen impaction 03/26/2014   . Controlled diabetes mellitus type II without complication 03/26/2014  . Blood in the urine 03/26/2014  . Fatigue 03/26/2014  . Chalazion, suppurating 03/26/2014  . Calcium blood increased 03/26/2014  . Gonalgia 03/26/2014  . Elevated WBC count 03/26/2014  . Anemia, macrocytic 03/26/2014  . Arthritis, degenerative 03/26/2014  . Abnormal blood sugar 03/26/2014  . HLD (hyperlipidemia) 03/26/2014  . Basal cell papilloma 03/26/2014  . Arthralgia of shoulder 03/26/2014  . Rotator cuff arthropathy 03/26/2014  . Pain in the wrist 03/26/2014  . SCH (subconjunctival hemorrhage) 03/26/2014  . Limb swelling 03/26/2014  . Fast heart beat 03/26/2014  . Phlebectasia 03/26/2014    Marry Guan, PT, MPT 04/05/2015, 5:31 PM  [facility] [address]  [address] [place], [place], [address] Phone: [phone]   Fax:  [phone]

## 2015-04-09 ENCOUNTER — Ambulatory Visit: Payer: Medicare Other | Admitting: Physical Therapy

## 2015-04-09 DIAGNOSIS — R531 Weakness: Secondary | ICD-10-CM

## 2015-04-09 DIAGNOSIS — M25612 Stiffness of left shoulder, not elsewhere classified: Secondary | ICD-10-CM

## 2015-04-09 DIAGNOSIS — R29898 Other symptoms and signs involving the musculoskeletal system: Secondary | ICD-10-CM

## 2015-04-09 DIAGNOSIS — R269 Unspecified abnormalities of gait and mobility: Secondary | ICD-10-CM

## 2015-04-09 DIAGNOSIS — M25512 Pain in left shoulder: Secondary | ICD-10-CM

## 2015-04-09 NOTE — Therapy (Signed)
[facility] [place] [address]  [address] [place], [place], [address] Phone: [phone]   Fax:  [phone]  Physical Therapy Treatment  Patient Details  Name: [name] [name] MRN: [id number] Date of Birth: 07/16/1934 Referring Provider:  Teryl LucyLandau, Joshua, MD  Encounter Date: 04/09/2015      PT End of Session - 04/09/15 1332    Visit Number 14   Number of Visits 18   Date for PT Re-Evaluation 04/20/15   PT Start Time 1318   PT Stop Time 1402   PT Time Calculation (min) 44 min   Activity Tolerance Patient limited by fatigue;Patient limited by lethargy   Behavior During Therapy [facility] for tasks assessed/performed      Past Medical History  Diagnosis Date  . Blood clotting tendency     right leg went to lung  . Diabetes mellitus without complication   . History of kidney stones   . Arthritis   . Left rotator cuff tear arthropathy 11/14/2014  . Movement disorder   . Hypertension   . Vision abnormalities     Past Surgical History  Procedure Laterality Date  . Total knee arthroplasty      bilat  . Rotator cuff repair      bilat  . Cataract extraction w/ intraocular lens  implant, bilateral    . Reverse shoulder arthroplasty Left 11/14/2014    Procedure: REVERSE SHOULDER ARTHROPLASTY;  Surgeon: Eulas PostJoshua P Landau, MD;  Location: [facility] OR;  Service: Orthopedics;  Laterality: Left;    There were no vitals filed for this visit.  Visit Diagnosis:  Weakness generalized  Shoulder weakness  Abnormality of gait  Shoulder stiffness, left  Pain in joint, shoulder region, left      Subjective Assessment - 04/09/15 1322    Subjective Patient states more trouble with walking secondary to "weakness" in his knees. Also reports increased lethargy which he attributes to meds.   Currently in Pain? No/denies        TODAY'S TREATMENT  TherEx - NuStep lvl 5, 6'  Bridges 2x10 with small ball between knees Hooklying LTR holding small  ball between knees x15 Hooklying March 3# x10 Hooklying March 3# with contra hand to knee x10  Seated LAQ with 3# cuff wt 2x10 bilateral Forward step-ups - 4" step with bilateral HHA on RW Lateral & posterior step-downs - 4" step with bilateral HHA on RW  TherAct - At wife's request, reviewed transitions in/out of bed. Reminded patient to initiate transition into bed with sit to sidelying, then rolling to back to avoid diagonal position in bed. Reinforced segmental/log rolling to right before attempting to rise to sitting, then utilizing right UE pushing initially with elbow then extending UE to achieve full upright sitting posture. Patient requiring increased cues and CGA to complete transitions today.   Neuro - Side-stepping with PT HHA  Gait Training - Encouraged increased foot clearance and step length using masking tape targets at 16" intervals. Provided cues to reinforce good heel strike and heel-toe progression to reduce foot drag and associated tripping hazard. Patient with more difficultly demonstrating desired gait pattern even with verbal & visual cues today, with very limited carryover w/o cueing.          PT Short Term Goals - 03/29/15 1405    PT SHORT TERM GOAL #1   Title pt instructed in and independent with initial HEP   Status Achieved  PT Long Term Goals - 04/09/15 1510    PT LONG TERM GOAL #1   Title pt independent with advanced HEP as necessary for continued progress by 04/20/15   Status On-going   PT LONG TERM GOAL #2   Title L Shoulder Elevation AROM to 90 degrees or better by 04/20/15   Status On-going   PT LONG TERM GOAL #3   Title pt scores 49/56 or better on Berg assessment by 04/20/15   Status On-going   PT LONG TERM GOAL #4   Title pt able to perform all ADLs and transfers independently by 7/22/6   Status On-going   PT LONG TERM GOAL #5   Title L Shoulder ER AROM 35 at 90 ABD by 04/20/15   Status On-going               Plan -  04/09/15 1500    Clinical Impression Statement Increased fatigue/lethargy, potenitally secondary to recent changes in meds, limiting therapy tolerance and creating increased gait instability. Limited carryover noted with bed mobility technique with wife confirming need for further reveiew/training.   PT Next Visit Plan bed mobility training, LE strengthening, balance and gait training, L shoulder POC    Consulted and Agree with Plan of Care Patient;Family member/caregiver        Problem List Patient Active Problem List   Diagnosis Date Noted  . Parkinson disease 01/24/2015  . Gait disorder 01/24/2015  . Tremor 01/24/2015  . Abnormal gait 01/24/2015  . Idiopathic Parkinson's disease 01/24/2015  . Left rotator cuff tear arthropathy 11/14/2014  . Diabetes mellitus without complication 11/14/2014  . Blood clotting tendency 11/14/2014  . Malignant hypertension 11/14/2014  . Chronic diastolic heart failure 11/14/2014  . CKD (chronic kidney disease) stage 3, GFR 30-59 ml/min 11/14/2014  . Absolute anemia 03/26/2014  . Anxiety disorder 03/26/2014  . Benign essential HTN 03/26/2014  . Burning or prickling sensation 03/26/2014  . Cerumen impaction 03/26/2014  . Controlled diabetes mellitus type II without complication 03/26/2014  . Blood in the urine 03/26/2014  . Fatigue 03/26/2014  . Chalazion, suppurating 03/26/2014  . Calcium blood increased 03/26/2014  . Gonalgia 03/26/2014  . Elevated WBC count 03/26/2014  . Anemia, macrocytic 03/26/2014  . Arthritis, degenerative 03/26/2014  . Abnormal blood sugar 03/26/2014  . HLD (hyperlipidemia) 03/26/2014  . Basal cell papilloma 03/26/2014  . Arthralgia of shoulder 03/26/2014  . Rotator cuff arthropathy 03/26/2014  . Pain in the wrist 03/26/2014  . SCH (subconjunctival hemorrhage) 03/26/2014  . Limb swelling 03/26/2014  . Fast heart beat 03/26/2014  . Phlebectasia 03/26/2014    Marry Guan, PT, MPT 04/09/2015, 3:14 PM  [employer] [address]  [address] [place], [place], [address] Phone: [phone]   Fax:  [phone]

## 2015-04-11 ENCOUNTER — Ambulatory Visit: Payer: Medicare Other | Admitting: Rehabilitation

## 2015-04-11 DIAGNOSIS — R269 Unspecified abnormalities of gait and mobility: Secondary | ICD-10-CM

## 2015-04-11 DIAGNOSIS — R531 Weakness: Secondary | ICD-10-CM

## 2015-04-11 DIAGNOSIS — M25612 Stiffness of left shoulder, not elsewhere classified: Secondary | ICD-10-CM

## 2015-04-11 DIAGNOSIS — M25512 Pain in left shoulder: Secondary | ICD-10-CM

## 2015-04-11 DIAGNOSIS — R29898 Other symptoms and signs involving the musculoskeletal system: Secondary | ICD-10-CM

## 2015-04-11 NOTE — Therapy (Signed)
[facility] [address]  [address] [place], [place], [address] Phone: [phone]   Fax:  [phone]  Physical Therapy Treatment  Patient Details  Name: [name] [name] MRN: [id number] Date of Birth: August 11, 1934 Referring Provider:  Teryl Lucy, MD  Encounter Date: 04/11/2015      PT End of Session - 04/11/15 1318    Visit Number 15   Number of Visits 18   Date for PT Re-Evaluation 04/20/15   PT Start Time 1318   PT Stop Time 1356   PT Time Calculation (min) 38 min      Past Medical History  Diagnosis Date  . Blood clotting tendency     right leg went to lung  . Diabetes mellitus without complication   . History of kidney stones   . Arthritis   . Left rotator cuff tear arthropathy 11/14/2014  . Movement disorder   . Hypertension   . Vision abnormalities     Past Surgical History  Procedure Laterality Date  . Total knee arthroplasty      bilat  . Rotator cuff repair      bilat  . Cataract extraction w/ intraocular lens  implant, bilateral    . Reverse shoulder arthroplasty Left 11/14/2014    Procedure: REVERSE SHOULDER ARTHROPLASTY;  Surgeon: Eulas Post, MD;  Location: [facility] OR;  Service: Orthopedics;  Laterality: Left;    There were no vitals filed for this visit.  Visit Diagnosis:  Weakness generalized  Shoulder weakness  Abnormality of gait  Shoulder stiffness, left  Pain in joint, shoulder region, left      Subjective Assessment - 04/11/15 1324    Subjective Reports feeling weak again today, not sure the reason. Just feels shaky and weak.    Currently in Pain? No/denies       TODAY'S TREATMENT TherEx - NuStep lvl 5, 6' UE/LE 6" Fwd Step ups x10 each leg with bilateral HHA on RW Bridges 2x10 Hooklying LTR holding small ball between knees x15 Hooklying bilateral Pullover with 2# x10 (hands on either side of the weight)  Hooklying bilateral Chest press with 2# x10 (hands on either side  of the weight) Hooklying Lt Shoulder IR/ER AROM with over pressure x10 Seated March 3# x10 Seated March 3# with contra UE lift x10 (needed verbal and tactile cues to perform) Seated LAQ with 3# cuff wt 2x10 bilateral  Neuro - Toe taps to 6" Step x10 with bilateral HHA on RW Side-stepping with PTA HHA 12 feet x2 each way Bwd walking with PTA HHA 8 ft x1        PT Short Term Goals - 03/29/15 1405    PT SHORT TERM GOAL #1   Title pt instructed in and independent with initial HEP   Status Achieved           PT Long Term Goals - 04/09/15 1510    PT LONG TERM GOAL #1   Title pt independent with advanced HEP as necessary for continued progress by 04/20/15   Status On-going   PT LONG TERM GOAL #2   Title L Shoulder Elevation AROM to 90 degrees or better by 04/20/15   Status On-going   PT LONG TERM GOAL #3   Title pt scores 49/56 or better on Berg assessment by 04/20/15   Status On-going   PT LONG TERM GOAL #4   Title pt able to perform all ADLs and transfers independently by 7/22/6   Status  On-going   PT LONG TERM GOAL #5   Title L Shoulder ER AROM 35 at 90 ABD by 04/20/15   Status On-going               Plan - 04/11/15 1357    Clinical Impression Statement Worked on some shoulder exercises per pt request. Pt does seems to fatigue quickly with most exercises and still needs cuing for bed mobility. More so with sit to supine than supine to sit.    Consulted and Agree with Plan of Care Patient        Problem List Patient Active Problem List   Diagnosis Date Noted  . Parkinson disease 01/24/2015  . Gait disorder 01/24/2015  . Tremor 01/24/2015  . Abnormal gait 01/24/2015  . Idiopathic Parkinson's disease 01/24/2015  . Left rotator cuff tear arthropathy 11/14/2014  . Diabetes mellitus without complication 11/14/2014  . Blood clotting tendency 11/14/2014  . Malignant hypertension 11/14/2014  . Chronic diastolic heart failure 11/14/2014  . CKD (chronic kidney  disease) stage 3, GFR 30-59 ml/min 11/14/2014  . Absolute anemia 03/26/2014  . Anxiety disorder 03/26/2014  . Benign essential HTN 03/26/2014  . Burning or prickling sensation 03/26/2014  . Cerumen impaction 03/26/2014  . Controlled diabetes mellitus type II without complication 03/26/2014  . Blood in the urine 03/26/2014  . Fatigue 03/26/2014  . Chalazion, suppurating 03/26/2014  . Calcium blood increased 03/26/2014  . Gonalgia 03/26/2014  . Elevated WBC count 03/26/2014  . Anemia, macrocytic 03/26/2014  . Arthritis, degenerative 03/26/2014  . Abnormal blood sugar 03/26/2014  . HLD (hyperlipidemia) 03/26/2014  . Basal cell papilloma 03/26/2014  . Arthralgia of shoulder 03/26/2014  . Rotator cuff arthropathy 03/26/2014  . Pain in the wrist 03/26/2014  . SCH (subconjunctival hemorrhage) 03/26/2014  . Limb swelling 03/26/2014  . Fast heart beat 03/26/2014  . Phlebectasia 03/26/2014    Ronney LionShelby J Exa Bomba, PTA 04/11/2015, 1:58 PM  [facility] [place] [address]  [address] [place], [place], [address] Phone: [phone]   Fax:  [phone]

## 2015-04-12 ENCOUNTER — Encounter: Payer: Medicare Other | Admitting: Rehabilitation

## 2015-04-16 ENCOUNTER — Telehealth: Payer: Self-pay | Admitting: Neurology

## 2015-04-16 ENCOUNTER — Ambulatory Visit: Payer: Medicare Other | Admitting: Physical Therapy

## 2015-04-16 DIAGNOSIS — M25612 Stiffness of left shoulder, not elsewhere classified: Secondary | ICD-10-CM

## 2015-04-16 DIAGNOSIS — R29898 Other symptoms and signs involving the musculoskeletal system: Secondary | ICD-10-CM

## 2015-04-16 DIAGNOSIS — R531 Weakness: Secondary | ICD-10-CM | POA: Diagnosis not present

## 2015-04-16 DIAGNOSIS — R269 Unspecified abnormalities of gait and mobility: Secondary | ICD-10-CM

## 2015-04-16 NOTE — Telephone Encounter (Signed)
Pt's wife called and is wondering about carbidopa-levodopa (SINEMET) 25-100 MG per tablet, the wife is saying that she thought the Dr. Catalina Pizzaold them 4 times a day instead of what the Rx says, which is 3 x day. Please call and advise [phone]

## 2015-04-16 NOTE — Therapy (Signed)
[facility] [address]  [address] [place], [place], [address] Phone: [phone]   Fax:  [phone]  Physical Therapy Treatment  Patient Details  Name: [name] [name] MRN: [id number] Date of Birth: [birth year] Referring Provider:  Teryl Lucy, MD  Encounter Date: 04/16/2015      PT End of Session - 04/16/15 1317    Visit Number 16   Number of Visits 18   Date for PT Re-Evaluation 04/20/15   PT Start Time 1316   PT Stop Time 1358   PT Time Calculation (min) 42 min      Past Medical History  Diagnosis Date  . Blood clotting tendency     right leg went to lung  . Diabetes mellitus without complication   . History of kidney stones   . Arthritis   . Left rotator cuff tear arthropathy 11/14/2014  . Movement disorder   . Hypertension   . Vision abnormalities     Past Surgical History  Procedure Laterality Date  . Total knee arthroplasty      bilat  . Rotator cuff repair      bilat  . Cataract extraction w/ intraocular lens  implant, bilateral    . Reverse shoulder arthroplasty Left 11/14/2014    Procedure: REVERSE SHOULDER ARTHROPLASTY;  Surgeon: Eulas Post, MD;  Location: [facility] OR;  Service: Orthopedics;  Laterality: Left;    There were no vitals filed for this visit.  Visit Diagnosis:  Weakness generalized  Abnormality of gait  Shoulder weakness  Shoulder stiffness, left      Subjective Assessment - 04/16/15 1318    Subjective continues to c/o feeling weak/fatigued.  States getting in/out of bed is getting easier but still requires some assistance from his wife.   Currently in Pain? No/denies           TODAY'S TREATMENT TherEx - Seated trunk isometrics all planes 5x5" each Seated one-arm low row with trunk rotation Double Blue TB 2x10 each Bridge 10x Partial Single leg Bridge 8x each Hand to contralateral knee trunk rotation with isometric hold 5x5" each Hooklying SPC B shoulder flexion  AAROM 10x Hooklying SPC Chest press with 3# on cane 8x (requires PT assist with some portions of concentric ROM but independent with eccentric) Diagonal UE reach/UTR 8x each with PT assist Standing SPC Shoulder AAROM into ABD and Flexion 8x each (standing unsupported to allow some balance/stability trainning)  TherAct - sit<->supine training with going in and out of R side-lying.  Good ability with LEs but still requires Min A to lift upper trunk during side-lying->sit due to weakness in B shoulders.  Requires VC and sometimes TC for L UE positioning and use. Sit<->Stand training without UE use 4x                          PT Short Term Goals - 03/29/15 1405    PT SHORT TERM GOAL #1   Title pt instructed in and independent with initial HEP   Status Achieved           PT Long Term Goals - 04/09/15 1510    PT LONG TERM GOAL #1   Title pt independent with advanced HEP as necessary for continued progress by 04/20/15   Status On-going   PT LONG TERM GOAL #2   Title L Shoulder Elevation AROM to 90 degrees or better by 04/20/15   Status On-going   PT  LONG TERM GOAL #3   Title pt scores 49/56 or better on Berg assessment by 04/20/15   Status On-going   PT LONG TERM GOAL #4   Title pt able to perform all ADLs and transfers independently by 7/22/6   Status On-going   PT LONG TERM GOAL #5   Title L Shoulder ER AROM 35 at 90 ABD by 04/20/15   Status On-going               Plan - 04/16/15 1631    Clinical Impression Statement some improvement noted with bed mobility in that was able to perform one repitition of supine->sit without physical assistance but most reps still require MinA.  R shoulder still with very limited ROM and nearly nonfunctional strength.  Seated trunk isometrics much stronger and quicker response time today vs last time performed with this PT.   PT Next Visit Plan bed mobility training, LE strengthening, balance and gait training, L shoulder AROM  and Strengthening; pt leaving town in 2 weeks for 4 weeks, renew POC through July then DC    Consulted and Agree with Plan of Care Patient        Problem List Patient Active Problem List   Diagnosis Date Noted  . Parkinson disease 01/24/2015  . Gait disorder 01/24/2015  . Tremor 01/24/2015  . Abnormal gait 01/24/2015  . Idiopathic Parkinson's disease 01/24/2015  . Left rotator cuff tear arthropathy 11/14/2014  . Diabetes mellitus without complication 11/14/2014  . Blood clotting tendency 11/14/2014  . Malignant hypertension 11/14/2014  . Chronic diastolic heart failure 11/14/2014  . CKD (chronic kidney disease) stage 3, GFR 30-59 ml/min 11/14/2014  . Absolute anemia 03/26/2014  . Anxiety disorder 03/26/2014  . Benign essential HTN 03/26/2014  . Burning or prickling sensation 03/26/2014  . Cerumen impaction 03/26/2014  . Controlled diabetes mellitus type II without complication 03/26/2014  . Blood in the urine 03/26/2014  . Fatigue 03/26/2014  . Chalazion, suppurating 03/26/2014  . Calcium blood increased 03/26/2014  . Gonalgia 03/26/2014  . Elevated WBC count 03/26/2014  . Anemia, macrocytic 03/26/2014  . Arthritis, degenerative 03/26/2014  . Abnormal blood sugar 03/26/2014  . HLD (hyperlipidemia) 03/26/2014  . Basal cell papilloma 03/26/2014  . Arthralgia of shoulder 03/26/2014  . Rotator cuff arthropathy 03/26/2014  . Pain in the wrist 03/26/2014  . SCH (subconjunctival hemorrhage) 03/26/2014  . Limb swelling 03/26/2014  . Fast heart beat 03/26/2014  . Phlebectasia 03/26/2014    Weslie Rasmus PT, OCS 04/16/2015, 4:35 PM  [facility] [place] [address]  [address] [place], [place], [address] Phone: [phone]   Fax:  [phone]

## 2015-04-16 NOTE — Telephone Encounter (Signed)
I have spoken with [name] [name]--I believe she has gotten confused with recent med changes, as there have been many.  [name] [name] was on reg. Sinemet tid, then RAS added an hs dose of CR Sinemet.  [name] [name] was unable to tolerate the hs CR Sinemet  so it was d/c'd.  So now he should be taking reg. Sinemet tid.  She verbalized understanding of same./fim

## 2015-04-18 ENCOUNTER — Ambulatory Visit: Payer: Medicare Other | Admitting: Physical Therapy

## 2015-04-18 DIAGNOSIS — R531 Weakness: Secondary | ICD-10-CM

## 2015-04-18 DIAGNOSIS — R269 Unspecified abnormalities of gait and mobility: Secondary | ICD-10-CM

## 2015-04-18 DIAGNOSIS — R29898 Other symptoms and signs involving the musculoskeletal system: Secondary | ICD-10-CM

## 2015-04-18 DIAGNOSIS — M25612 Stiffness of left shoulder, not elsewhere classified: Secondary | ICD-10-CM

## 2015-04-18 DIAGNOSIS — M25512 Pain in left shoulder: Secondary | ICD-10-CM

## 2015-04-18 NOTE — Therapy (Signed)
[facility] [address]  [address] [place], [place], [address] Phone: [phone]   Fax:  [phone]  Physical Therapy Treatment  Patient Details  Name: [name] [name] MRN: [id number] Date of Birth: 1933/11/14 Referring Provider:  Teryl Lucy, MD  Encounter Date: 04/18/2015      PT End of Session - 04/18/15 1105    Visit Number 17   Number of Visits 19   Date for PT Re-Evaluation 04/27/15   PT Start Time 1104   PT Stop Time 1150   PT Time Calculation (min) 46 min      Past Medical History  Diagnosis Date  . Blood clotting tendency     right leg went to lung  . Diabetes mellitus without complication   . History of kidney stones   . Arthritis   . Left rotator cuff tear arthropathy 11/14/2014  . Movement disorder   . Hypertension   . Vision abnormalities     Past Surgical History  Procedure Laterality Date  . Total knee arthroplasty      bilat  . Rotator cuff repair      bilat  . Cataract extraction w/ intraocular lens  implant, bilateral    . Reverse shoulder arthroplasty Left 11/14/2014    Procedure: REVERSE SHOULDER ARTHROPLASTY;  Surgeon: Eulas Post, MD;  Location: [facility] OR;  Service: Orthopedics;  Laterality: Left;    There were no vitals filed for this visit.  Visit Diagnosis:  Weakness generalized - Plan: PT plan of care cert/re-cert  Abnormality of gait - Plan: PT plan of care cert/re-cert  Shoulder weakness - Plan: PT plan of care cert/re-cert  Shoulder stiffness, left - Plan: PT plan of care cert/re-cert  Pain in joint, shoulder region, left - Plan: PT plan of care cert/re-cert      Subjective Assessment - 04/18/15 1106    Subjective no c/o pain today.  States still plans on going out of town for the month of August.   Currently in Pain? No/denies            [facility] PT Assessment - 04/18/15 0001    ROM / Strength   AROM / PROM / Strength AROM;PROM;Strength   AROM   AROM Assessment  Site Shoulder   Right/Left Shoulder Left   Left Shoulder Flexion 60 Degrees   Left Shoulder ABduction 60 Degrees   PROM   PROM Assessment Site Shoulder   Right/Left Shoulder Left   Left Shoulder Flexion 85 Degrees   Left Shoulder ABduction 80 Degrees   Left Shoulder Internal Rotation 70 Degrees   Left Shoulder External Rotation 21 Degrees   Berg Balance Test   Sit to Stand Able to stand without using hands and stabilize independently   Standing Unsupported Able to stand safely 2 minutes   Sitting with Back Unsupported but Feet Supported on Floor or Stool Able to sit safely and securely 2 minutes   Stand to Sit Sits safely with minimal use of hands   Transfers Able to transfer safely, minor use of hands   Standing Unsupported with Eyes Closed Able to stand 10 seconds safely   Standing Ubsupported with Feet Together Able to place feet together independently and stand for 1 minute with supervision   From Standing, Reach Forward with Outstretched Arm Can reach confidently >25 cm (10")   From Standing Position, Pick up Object from Floor Able to pick up shoe, needs supervision   From Standing Position, Turn to  Look Behind Over each Shoulder Turn sideways only but maintains balance   Turn 360 Degrees Able to turn 360 degrees safely but slowly   Standing Unsupported, Alternately Place Feet on Step/Stool Able to complete 4 steps without aid or supervision   Standing Unsupported, One Foot in Front Able to take small step independently and hold 30 seconds   Standing on One Leg Tries to lift leg/unable to hold 3 seconds but remains standing independently   Total Score 43          TODAYS TREATMENT L Shoulder ROM Manual - L shoulder grade 3 into flexion, ABD, and ER.  Contract/Relax in ER TherEx - RROM L Shoulder ER/IR at 45 ABD 2x6 AAOM L Shoulder Flexion supine flexion with RROM Extension 2x6 Seated Trunk rotation isometrics 8x5" each Hand to contralateral knee trunk rotation with  isometric hold 10x3" each  TherAct - supine<->sit to L training.  Much more difficult and requires Min to Mod assist going to L due to L UE weakness Supine<->sit to R training.  Continues to require VC but ultimately is able to perform both sit->supine and supine->sit with SBA  Berg Assessment                  PT Education - 04/18/15 1152    Education provided Yes   Education Details instructed in hand to knee alternating trunk rotation isometrics - will give HEP HO next week   Person(s) Educated Patient   Methods Explanation;Demonstration   Comprehension Verbalized understanding;Returned demonstration          PT Short Term Goals - 03/29/15 1405    PT SHORT TERM GOAL #1   Title pt instructed in and independent with initial HEP   Status Achieved           PT Long Term Goals - 04/18/15 1153    PT LONG TERM GOAL #1   Title pt independent with advanced HEP as necessary for continued progress by 04/27/15   Status On-going   PT LONG TERM GOAL #2   Title L Shoulder Elevation AROM to 90 degrees or better by 04/27/15   Status On-going   PT LONG TERM GOAL #3   Title pt scores 49/56 or better on Berg assessment by 04/27/15   Status On-going   PT LONG TERM GOAL #4   Title pt able to perform all ADLs and transfers independently by 7/29/6   Status On-going   PT LONG TERM GOAL #5   Title L Shoulder ER AROM 35 at 90 ABD by 04/27/15   Status On-going               Plan - 04/18/15 1154    Clinical Impression Statement Treatments have been focusing more on pt safety through bed mobility and balance training with less time focusing on L shoulder specifically.  With this shift, his shoulder ROM has not changed significantly in the past 4 weeks but other improvements have been made.  He is able to performs sit<->supine transfers with SBA when going to the R whereas he had consistently required Min to Mod Assist in the past.  He continues to require Min to Mod A when going  to the L but this is not the direction transfers are performed at home.  In addition, Berg balance assessment notes improvement from 36/56 to 43/56.  This score is still a high fall risk but is improving.  Our focus as been on Mr. Monarch symptoms/impairments related to Parkinson's  to include trunk strength/control, bed mobility training, and standing balance training. Small amounts of time are spent with L shoulder ROM and function.   Pt will benefit from skilled therapeutic intervention in order to improve on the following deficits Decreased range of motion;Decreased balance;Decreased mobility;Decreased strength;Difficulty walking;Postural dysfunction;Improper body mechanics;Pain;Impaired UE functional use;Abnormal gait;Decreased coordination;Decreased activity tolerance;Decreased endurance;Decreased knowledge of use of DME   Rehab Potential Fair   PT Frequency 2x / week   PT Duration --  1 more week then Mr. Dimple CaseyRice is going out of town for month of August.   PT Treatment/Interventions ADLs/Self Care Home Management;Cryotherapy;Retail bankerlectrical Stimulation;Gait training;Stair training;Therapeutic activities;Therapeutic exercise;Balance training;Patient/family education;Manual techniques;Vasopneumatic Device;Functional mobility training;Neuromuscular re-education   PT Next Visit Plan bed mobility training, LE strengthening, balance and gait training, L shoulder AROM and Strengthening   Consulted and Agree with Plan of Care Patient        Problem List Patient Active Problem List   Diagnosis Date Noted  . Parkinson disease 01/24/2015  . Gait disorder 01/24/2015  . Tremor 01/24/2015  . Abnormal gait 01/24/2015  . Idiopathic Parkinson's disease 01/24/2015  . Left rotator cuff tear arthropathy 11/14/2014  . Diabetes mellitus without complication 11/14/2014  . Blood clotting tendency 11/14/2014  . Malignant hypertension 11/14/2014  . Chronic diastolic heart failure 11/14/2014  . CKD (chronic kidney  disease) stage 3, GFR 30-59 ml/min 11/14/2014  . Absolute anemia 03/26/2014  . Anxiety disorder 03/26/2014  . Benign essential HTN 03/26/2014  . Burning or prickling sensation 03/26/2014  . Cerumen impaction 03/26/2014  . Controlled diabetes mellitus type II without complication 03/26/2014  . Blood in the urine 03/26/2014  . Fatigue 03/26/2014  . Chalazion, suppurating 03/26/2014  . Calcium blood increased 03/26/2014  . Gonalgia 03/26/2014  . Elevated WBC count 03/26/2014  . Anemia, macrocytic 03/26/2014  . Arthritis, degenerative 03/26/2014  . Abnormal blood sugar 03/26/2014  . HLD (hyperlipidemia) 03/26/2014  . Basal cell papilloma 03/26/2014  . Arthralgia of shoulder 03/26/2014  . Rotator cuff arthropathy 03/26/2014  . Pain in the wrist 03/26/2014  . SCH (subconjunctival hemorrhage) 03/26/2014  . Limb swelling 03/26/2014  . Fast heart beat 03/26/2014  . Phlebectasia 03/26/2014    Delinda Malan PT, OCS 04/18/2015, 12:58 PM  [facility] [address]  [address] [place], [place], [address] Phone: [phone]   Fax:  [phone]

## 2015-04-19 ENCOUNTER — Ambulatory Visit: Payer: Medicare Other | Admitting: Physical Therapy

## 2015-04-24 ENCOUNTER — Ambulatory Visit: Payer: Medicare Other | Admitting: Physical Therapy

## 2015-04-24 DIAGNOSIS — R29898 Other symptoms and signs involving the musculoskeletal system: Secondary | ICD-10-CM

## 2015-04-24 DIAGNOSIS — R531 Weakness: Secondary | ICD-10-CM

## 2015-04-24 DIAGNOSIS — M25612 Stiffness of left shoulder, not elsewhere classified: Secondary | ICD-10-CM

## 2015-04-24 DIAGNOSIS — R269 Unspecified abnormalities of gait and mobility: Secondary | ICD-10-CM

## 2015-04-24 NOTE — Therapy (Addendum)
[facility] [address]  [place], [place], [address] Phone: [phone]   Fax:  [phone]  Physical Therapy Treatment  Patient Details  Name: [name] [name] MRN: [id number] Date of Birth: [birth year] Referring Provider:  Marchia Bond, MD  Encounter Date: 04/24/2015      PT End of Session - 04/24/15 1541    Visit Number 18   Number of Visits 19   Date for PT Re-Evaluation 04/27/15   PT Start Time 3545   PT Stop Time 1610   PT Time Calculation (min) 31 min      Past Medical History  Diagnosis Date  . Blood clotting tendency     right leg went to lung  . Diabetes mellitus without complication   . History of kidney stones   . Arthritis   . Left rotator cuff tear arthropathy 11/14/2014  . Movement disorder   . Hypertension   . Vision abnormalities     Past Surgical History  Procedure Laterality Date  . Total knee arthroplasty      bilat  . Rotator cuff repair      bilat  . Cataract extraction w/ intraocular lens  implant, bilateral    . Reverse shoulder arthroplasty Left 11/14/2014    Procedure: REVERSE SHOULDER ARTHROPLASTY;  Surgeon: [name] Bridge, MD;  Location: [facility];  Service: Orthopedics;  Laterality: Left;    There were no vitals filed for this visit.  Visit Diagnosis:  Weakness generalized  Abnormality of gait  Shoulder weakness  Shoulder stiffness, left      Subjective Assessment - 04/24/15 1543    Subjective no c/o pain.  Pt states he has been sleeping in elevated bed since being discharged from hospital.  He doesn't know why he's doing this stating he does it because that's how that had him while he was in the hospital.  Pt states he experiences LBP upon waking in AM.   Currently in Pain? No/denies              TODAY'S TREATMENT TherEx - NuStep lvl 4, 4' Seated trunk isometrics all planes 8x5" each Seated one-arm low row with trunk rotation Double Blue TB 10x R, Single  Blue TB 12x L FW small lunge stepping over tband 6x each (SBA) Lateral lunges stepping over tband 6x each (SBA) Seated Diagonal punch diagonal Blue TB 10x each 8" step toe tapping 10x each (SBA) Standing SPC Shoulder AAROM into ABD and Flexion 8x each (standing unsupported to allow some balance/stability trainning) Standing at sink: counter push-ups 8x, B heel raise 15x, Hip ABD 10x each                     PT Short Term Goals - 03/29/15 1405    PT SHORT TERM GOAL #1   Title pt instructed in and independent with initial HEP   Status Achieved           PT Long Term Goals - 04/18/15 1153    PT LONG TERM GOAL #1   Title pt independent with advanced HEP as necessary for continued progress by 04/27/15   Status On-going   PT LONG TERM GOAL #2   Title L Shoulder Elevation AROM to 90 degrees or better by 04/27/15   Status On-going   PT LONG TERM GOAL #3   Title pt scores 49/56 or better on Berg assessment by 04/27/15   Status On-going   PT LONG TERM  GOAL #4   Title pt able to perform all ADLs and transfers independently by 7/29/6   Status On-going   PT LONG TERM GOAL #5   Title L Shoulder ER AROM 35 at 90 ABD by 04/27/15   Status On-going      G-Code: Carrying, handling objects Current / Discharge - 40-60% limitation (CK) Goal was 20-40% limitation (CJ)         Plan - 04/24/15 1610    Clinical Impression Statement pt has one session remaining then he is going out of town for 1 month so will be discharged.  He performed well with standing balance type exercises today but did not want to perform bed mobility activities today.   PT Next Visit Plan re-assess for d/c next treatment; review/update HEP   Consulted and Agree with Plan of Care Patient        Problem List Patient Active Problem List   Diagnosis Date Noted  . Parkinson disease 01/24/2015  . Gait disorder 01/24/2015  . Tremor 01/24/2015  . Abnormal gait 01/24/2015  . Idiopathic Parkinson's  disease 01/24/2015  . Left rotator cuff tear arthropathy 11/14/2014  . Diabetes mellitus without complication 22/97/9892  . Blood clotting tendency 11/14/2014  . Malignant hypertension 11/14/2014  . Chronic diastolic heart failure 11/94/1740  . CKD (chronic kidney disease) stage 3, GFR 30-59 ml/min 11/14/2014  . Absolute anemia 03/26/2014  . Anxiety disorder 03/26/2014  . Benign essential HTN 03/26/2014  . Burning or prickling sensation 03/26/2014  . Cerumen impaction 03/26/2014  . Controlled diabetes mellitus type II without complication 81/44/8185  . Blood in the urine 03/26/2014  . Fatigue 03/26/2014  . Chalazion, suppurating 03/26/2014  . Calcium blood increased 03/26/2014  . Gonalgia 03/26/2014  . Elevated WBC count 03/26/2014  . Anemia, macrocytic 03/26/2014  . Arthritis, degenerative 03/26/2014  . Abnormal blood sugar 03/26/2014  . HLD (hyperlipidemia) 03/26/2014  . Basal cell papilloma 03/26/2014  . Arthralgia of shoulder 03/26/2014  . Rotator cuff arthropathy 03/26/2014  . Pain in the wrist 03/26/2014  . Festus (subconjunctival hemorrhage) 03/26/2014  . Limb swelling 03/26/2014  . Fast heart beat 03/26/2014  . Phlebectasia 03/26/2014    [name] [name] PT, OCS 04/24/2015, 4:12 PM  [facility] [address]  [place], [place], [address] Phone: [phone]   Fax:  [phone]     PHYSICAL THERAPY DISCHARGE SUMMARY  Visits from Start of Care: 18  Current functional level related to goals / functional outcomes: Some progress but goals not met   Remaining deficits: Shoulder weakness and LOM, impaired balance, impaired functional mobility with transfers and bed mobility   Education / Equipment: Transfer training, HEP Plan: Patient agrees to discharge.  Patient goals were not met. Patient is being discharged due to the patient's request.  ?????       [name] [name] made some progress with regard to level  of function and was last seen on 04/24/15.  He was to return for one more treatment and re-assessment but he cancelled that appointment then went out of town for the month of August.  Due to not seeing [name] [name] in over 30 days we are discharging him from our care at this time.

## 2015-04-26 ENCOUNTER — Ambulatory Visit: Payer: Medicare Other | Admitting: Physical Therapy

## 2015-05-10 ENCOUNTER — Telehealth: Payer: Self-pay | Admitting: Neurology

## 2015-05-10 MED ORDER — CARBIDOPA-LEVODOPA 25-100 MG PO TABS: 1.0000 | ORAL_TABLET | Freq: Three times a day (TID) | ORAL | Status: DC

## 2015-05-10 NOTE — Telephone Encounter (Signed)
Rx has been sent  

## 2015-05-10 NOTE — Telephone Encounter (Signed)
Spouse called requesting refill on carbidopa-levodopa (SINEMET) 25-100 MG per tablet. They are out of town and need medication sent to [facility] in  [place] [phone], Fax# [phone]

## 2015-06-14 ENCOUNTER — Encounter (HOSPITAL_BASED_OUTPATIENT_CLINIC_OR_DEPARTMENT_OTHER): Payer: Self-pay

## 2015-06-14 ENCOUNTER — Emergency Department (HOSPITAL_BASED_OUTPATIENT_CLINIC_OR_DEPARTMENT_OTHER)
Admission: EM | Admit: 2015-06-14 | Discharge: 2015-06-14 | Discharge status: Against Medical Advice | Payer: Medicare Other | Attending: Emergency Medicine | Admitting: Emergency Medicine

## 2015-06-14 DIAGNOSIS — L98499 Non-pressure chronic ulcer of skin of other sites with unspecified severity: Secondary | ICD-10-CM | POA: Diagnosis present

## 2015-06-14 DIAGNOSIS — E119 Type 2 diabetes mellitus without complications: Secondary | ICD-10-CM | POA: Diagnosis not present

## 2015-06-14 DIAGNOSIS — I10 Essential (primary) hypertension: Secondary | ICD-10-CM | POA: Diagnosis not present

## 2015-06-14 DIAGNOSIS — G20 Parkinson's disease: Secondary | ICD-10-CM | POA: Insufficient documentation

## 2015-06-14 HISTORY — DX: Parkinson's disease without dyskinesia, without mention of fluctuations: G20.A1

## 2015-06-14 HISTORY — DX: Parkinson's disease: G20

## 2015-06-14 NOTE — ED Notes (Signed)
Talked extensively with the patient and the patients daughter about wait times and the risk of leaving verses staying. The patient and family report that they do not want ed stay. Patient signed out AMA due to the possibility of seeing MD in the AM

## 2015-06-14 NOTE — ED Notes (Addendum)
Pt states he has been mostly bed ridden x 2 months since rotator cuff surgery-ulcers to buttocks x 6 weeks-has not sought medical treatment-pt presents to triage in w/c

## 2015-07-03 ENCOUNTER — Ambulatory Visit: Payer: Medicare Other | Admitting: Neurology

## 2015-07-05 ENCOUNTER — Encounter: Payer: Self-pay | Admitting: Neurology

## 2015-07-09 ENCOUNTER — Telehealth: Payer: Self-pay | Admitting: Neurology

## 2015-07-09 NOTE — Telephone Encounter (Signed)
Okay to schedule only as per Dr. Bonnita Hollow recommendation. I reviewed the last note, which appears to be right on track. I can also provide a second opinion, which may be acceptable to patient.

## 2015-07-09 NOTE — Telephone Encounter (Signed)
Phone call from wife, husband was scheduled to see Dr. Epimenio Foot (no show appointment 10/4). Wife says she sees Dr. Epimenio Foot for MS but husband does not have MS he has Parkinson's and they want to get him in with a Parkinson's Specialist. Please call wife to discuss this [phone].

## 2015-07-10 ENCOUNTER — Ambulatory Visit: Payer: Medicare Other | Admitting: Neurology

## 2015-07-10 NOTE — Telephone Encounter (Signed)
I have spoken with [name] [name] this morning and offered an appt. with Dr. Frances Furbish tomorrow.  She has accepted this appt. and will call back if they are not able to keep it/fim

## 2015-07-11 ENCOUNTER — Ambulatory Visit (INDEPENDENT_AMBULATORY_CARE_PROVIDER_SITE_OTHER): Payer: Medicare Other | Admitting: Neurology

## 2015-07-11 ENCOUNTER — Encounter: Payer: Self-pay | Admitting: Neurology

## 2015-07-11 VITALS — BP 92/58 | HR 68 | Resp 14

## 2015-07-11 DIAGNOSIS — Z9181 History of falling: Secondary | ICD-10-CM

## 2015-07-11 DIAGNOSIS — I959 Hypotension, unspecified: Secondary | ICD-10-CM | POA: Diagnosis not present

## 2015-07-11 DIAGNOSIS — G20 Parkinson's disease: Secondary | ICD-10-CM

## 2015-07-11 DIAGNOSIS — F411 Generalized anxiety disorder: Secondary | ICD-10-CM

## 2015-07-11 DIAGNOSIS — R269 Unspecified abnormalities of gait and mobility: Secondary | ICD-10-CM

## 2015-07-11 NOTE — Progress Notes (Signed)
Subjective:    Patient ID: [name] [name] is a 81 y.o. male.  HPI     Star Age, MD, PhD [facility] [address], [address] [place], [place] [address]  Dear Delfino Lovett,   I saw your patient, [name] [name], upon your kind request in my clinic today for second opinion of his parkinsonism. The patient is accompanied by his wife today. As you know, [name] [name] is a 81 year old right-handed gentleman with an underlying medical history of diabetes, kidney stones, arthritis, hypertension, status post bilateral knee replacement surgeries, bilateral rotator cuff surgeries, bilateral cataract surgeries, who reports progressive gait difficulties since the February 2016 after his shoulder surgery. He first saw you on 01/24/2015. He felt that he had signs and symptoms of Parkinson's disease. He suggested increasing his Celexa for his anxiety. He started him on Sinemet in April 2016. He was last seen by you in June 2016 and he reported improvement of his gait disorder and parkinsonian symptoms with Sinemet. He still has residual anxiety. He is currently on Sinemet, 25-100 milligrams strength, 2 pills 3 times a day usually with his meals. When he went to [place] in August of this year and spent time with his son, he also saw a neurologist and apparently had a DaT scan which was abnormal with reduced uptake on the left side from what I understand. He is unable to walk at this time. He is unable to stand without assistance and has a tendency to fall. He has fallen in the past. Symptoms may date back to about a year ago in fact when he started having difficulty with weakness and fine motor control of his right upper extremity. He is practically wheelchair-bound at this time. His primary care physician has ordered home health therapies. He also goes to an integrative therapy Center and receives glutathione infusions. He has had blood work and get supplements for B12, and vitamin D. Apparently  his B12 level and vitamin D levels were low, and his testosterone level was reportedly also low. He does not drink enough water. He does not average 4 glasses of water per day. He has had low blood pressure values with systolic values in the 09X recently and his primary care physician reduced his metoprolol just 4 days ago. Since then, his blood pressure has improved a little.  He has a long-standing history of anxiety which precedes his parkinsonism. He has been on Celexa for years. He has also been on BuSpar in the past and Valium. He no longer takes these other medications but is on Xanax, low dose, usually a quarter of a pill 3 times a day.  His Past Medical History Is Significant For: Past Medical History  Diagnosis Date  . Blood clotting tendency     right leg went to lung  . Diabetes mellitus without complication ([place])   . History of kidney stones   . Arthritis   . Left rotator cuff tear arthropathy 11/14/2014  . Movement disorder   . Hypertension   . Vision abnormalities   . Parkinson disease ([place])   . Anxiety     His Past Surgical History Is Significant For: Past Surgical History  Procedure Laterality Date  . Total knee arthroplasty      bilat  . Rotator cuff repair      bilat  . Cataract extraction w/ intraocular lens  implant, bilateral    . Reverse shoulder arthroplasty Left 11/14/2014    Procedure: REVERSE SHOULDER ARTHROPLASTY;  Surgeon: Johnny Bridge, MD;  Location: [facility];  Service: Orthopedics;  Laterality: Left;    His Family History Is Significant For: Family History  Problem Relation Age of Onset  . Stroke Mother   . Healthy Father     His Social History Is Significant For: Social History   Social History  . Marital Status: Married    Spouse Name: N/A  . Number of Children: 1  . Years of Education: 13    Occupational History  . Retired     Social History Main Topics  . Smoking status: Never Smoker   . Smokeless tobacco: None  . Alcohol Use: No   . Drug Use: No  . Sexual Activity: Not Asked   Other Topics Concern  . None   Social History Narrative   Rare caffeine consumption     His Allergies Are:  Allergies  Allergen Reactions  . Codeine Other (See Comments)  . Other Other (See Comments)    Blood Products-[place]ehovas Witness Unknown Pain Medication caused hallucinations  :   His Current Medications Are:  Outpatient Encounter Prescriptions as of 79/08/2015  Medication Sig  . acetaminophen (TYLENOL) 650 MG CR tablet Take 650 mg by mouth.  . ALPRAZolam (XANAX) 0.5 MG tablet Take 0.5 mg by mouth.  . Blood Glucose Monitoring Suppl (ONE TOUCH ULTRA SYSTEM KIT) W/DEVICE KIT Use as instructed  . carbidopa-levodopa (SINEMET IR) 25-100 MG tablet Take 2 tablets by mouth 3 (three) times daily.  . citalopram (CELEXA) 20 MG tablet Take 20 mg by mouth daily.   Marland Kitchen glipiZIDE (GLUCOTROL) 5 MG tablet Take 5 mg by mouth.  Marland Kitchen glucose blood test strip 100 each by Other route two (2) times a day as needed.  . Lancets (ONETOUCH ULTRASOFT) lancets by Does not apply route.  Marland Kitchen lisinopril (PRINIVIL,ZESTRIL) 5 MG tablet Take 5 mg by mouth daily.   . metFORMIN (GLUMETZA) 500 MG (MOD) 24 hr tablet Take 2,000 mg by mouth at bedtime.   . metoprolol tartrate (LOPRESSOR) 25 MG tablet Take 25 mg by mouth 2 (two) times daily.  . Omega-3 1000 MG CAPS Take by mouth.  . Probiotic Product (PROBIOTIC PO) Take by mouth.  . rivaroxaban (XARELTO) 20 MG TABS tablet Take 20 mg by mouth daily with supper.   . [DISCONTINUED] bisacodyl (DULCOLAX) 10 MG suppository Place 1 suppository (10 mg total) rectally daily as needed for moderate constipation.  . [DISCONTINUED] busPIRone (BUSPAR) 10 MG tablet Take 1 tablet (10 mg total) by mouth 2 (two) times daily.  . [DISCONTINUED] carbidopa-levodopa (SINEMET CR) 50-200 MG per tablet Take 1 tablet by mouth at bedtime.  . [DISCONTINUED] carbidopa-levodopa (SINEMET) 25-100 MG per tablet Take 1 tablet by mouth 3 (three) times daily.   . [DISCONTINUED] docusate sodium (COLACE) 100 MG capsule Take 1 capsule (100 mg total) by mouth 2 (two) times daily. (Patient not taking: Reported on 01/24/2015)  . [DISCONTINUED] HYDROcodone-acetaminophen (NORCO) 10-325 MG per tablet Take 1-2 tablets by mouth every 6 (six) hours as needed. (Patient not taking: Reported on 03/06/2015)  . [DISCONTINUED] metoprolol (LOPRESSOR) 100 MG tablet Take 50 mg by mouth 2 (two) times daily.   . [DISCONTINUED] ondansetron (ZOFRAN) 4 MG tablet Take 1 tablet (4 mg total) by mouth every 8 (eight) hours as needed for nausea or vomiting. (Patient not taking: Reported on 01/24/2015)  . [DISCONTINUED] QUEtiapine (SEROQUEL) 25 MG tablet Take 1 tablet (25 mg total) by mouth at bedtime.  . [DISCONTINUED] sennosides-docusate sodium (SENOKOT-S) 8.6-50 MG tablet  Take 2 tablets by mouth daily. (Patient not taking: Reported on 01/24/2015)   No facility-administered encounter medications on file as of 07/11/2015.  :   Review of Systems:  Out of a complete 14 point review of systems, all are reviewed and negative with the exception of these symptoms as listed below:    Review of Systems  Constitutional: Positive for fatigue.       Weight loss  HENT: Positive for rhinorrhea.   Gastrointestinal: Positive for constipation.  Neurological:       Patient has some questions about nutrition, he would like to be evaluated for where he is in the disease process, treatment options, and suggestions about home health.   Psychiatric/Behavioral:       Anxiety, decreased energy, change in appetitie, disinterest in activities    Objective:  Neurologic Exam  Physical Exam Physical Examination:   Filed Vitals:   07/11/15 1012  BP: 92/58  Pulse: 68  Resp: 14    General Examination: The patient is a very pleasant 81 y.o. male in no acute distress.  HEENT: Normocephalic, atraumatic, pupils are equal, round and reactive to light and accommodation. Extraocular tracking shows  moderate saccadic breakdown without nystagmus. He has limitation to upper gaze. His hearing seems intact. He has moderate to severe nuchal rigidity. He has moderate facial masking and no significant sialorrhea is noted. Mouth is in fact noted to be moderately dry. He has an intermittent lower lip tremor. He has significant hypophonia but no significant dysarthria.   Chest: is clear to auscultation without wheezing, rhonchi or crackles noted.  Heart: sounds are regular and normal without murmurs, rubs or gallops noted.   Abdomen: is soft, non-tender and non-distended with normal bowel sounds appreciated on auscultation.  Extremities: There is trace pitting edema in the distal lower extremities bilaterally. Pedal pulses are intact.   Skin: is warm and dry with no trophic changes noted. Age-related changes are noted on the skin.   Musculoskeletal: exam reveals significant decrease in range of motion in both shoulders, left more than right. He also reports left shoulder pain.   Neurologically:  Mental status: The patient is awake and alert, paying good  attention. He is able to partially provide the history. His wife provides most of the history. His friend also provides details and takes notes. She also appears keep his records.  There is mild degree of bradyphrenia. He has thin bulk and global strength of maybe 4 out of 5. He has no significant resting tremor at this time. Tone is globally moderately rigid with some cogwheeling noted in both upper extremities. Fine motor skills are moderate to severely impaired in both upper and lower extremities, perhaps a little worse on the right side.  Sensory exam is intact to light touch, reflexes are trace throughout. He has decrease in range of motion significantly in both shoulders, left more than right.   Romberg is not testable because he cannot stand without support. He stands up with significant difficulty and needs maximum support from both sides. He  does not stand unsupported and does not push his knees through. He takes a few steps with maximum assistance but has a tendency to lean backwards and would fall without support. He is unable to turn.   Assessment and Plan:   In summary, [name] [name] is a very pleasant 81 y.o.-year old male with an underlying medical history of diabetes, kidney stones, arthritis, hypertension, status post bilateral knee replacement surgeries, bilateral rotator cuff surgeries,  bilateral cataract surgeries, whose history and physical exam supports the diagnosis of parkinsonism, probably right-sided predominant Parkinson's disease, with advanced findings. He has been on levodopa 2 pills 3 times a day. Today I suggested that we adjust this a little bit to make sure he takes it without meals for better absorption. To that end, I suggested he take it 4 times a day namely at 8, 12, 4 PM and 8 PM, 1-1/2 pills each time for a total dose of 6 pills daily. He is advised strongly to drink more water, about 6 glasses per day if possible. He is advised to monitor his blood pressure values and he recently had a reduction of his beta blocker. Since then his blood pressure picked up a little bit. This may need to be adjusted further. He is also on lisinopril. He has been referred to home health therapies. He reports residual anxiety. I suggested that they talk to primary care physician about potentially increasing the Celexa down the road and ultimately he may benefit from seeing a geriatric psychiatrist. I suggested a three-month follow-up with you. I can see him back on an as-needed basis. I answered all their questions today and the patient and his caretakers were in agreement. Thank you very much for allowing me to participate in the care of this nice patient. If I can be of any further assistance to you please do not hesitate to talk to me.   Sincerely,   Star Age, MD, PhD

## 2015-07-11 NOTE — Patient Instructions (Signed)
I think your Parkinson's disease has remained fairly stable, which is reassuring. Nevertheless, as you know, this disease does progress with time. It can affect your balance, your memory, your mood, your bowel and bladder function, your posture, balance and walking and your activities of daily living. However, there are good supportive treatments and symptomatic treatments available, so most patients have a change to a good quality life and life expectancy is not typically altered. Overall you are doing fairly well but I do want to suggest a few things today:  Remember to drink plenty of fluid at least 6 glasses (8 oz each), eat healthy meals and do not skip any meals. Try to eat protein with a every meal and eat a healthy snack such as fruit or nuts in between meals. Try to keep a regular sleep-wake schedule and try to exercise daily, particularly in the form of sit down exercises.   Taking your medication on schedule is key.   Try to stay active physically and mentally. Engage in social activities in your community and with your family and try to keep up with current events by reading the newspaper or watching the news. Try to do word puzzles and you may like to do puzzles and brain games on the computer such as on [email or web address].   As far as your medications are concerned, I would like to suggest that you take your current medication with the following additional changes: take your sinemet without food for better absorption: therefore take it about an hour before meals: take 1 1/2 pills, 4 times a day, at 8, 12, 4 PM, and 8 PM. Consider increasing your Celexa at some point.   As far as diagnostic testing, I will order: no new test needed.   I will see you back as needed. Please make a follow up appointment with Dr. Epimenio FootSater in 3 months.  Our phone number is [phone]. We also have an after hours call service for urgent matters and there is a physician on-call for urgent questions, that cannot wait till  the next work day. For any emergencies you know to call 911 or go to the nearest emergency room.   You can email me through my chart and also leave a phone message for Lafonda Mossesiana, my nurse.

## 2015-07-16 ENCOUNTER — Telehealth: Payer: Self-pay | Admitting: Neurology

## 2015-07-16 NOTE — Telephone Encounter (Signed)
I have spoken with [name] [name].  He sts. Tresa EndoKelly (family friend of Ron and Laurell JosephsJudy Elk) has been very aggressive with him in trying to get daily cna services for him--trying to get him into hospice care.  He feels Tresa EndoKelly may contact our office for a referral.  I have also spoken with Dr. Frances FurbishAthar.  She sts. Tresa EndoKelly came to Ron's appt. with her last week, and was also very aggressive with her, in asking questions, speaking for pt., not really allowing Dr. Frances FurbishAthar to set the tone for the appt.  Will pass update along to RAS/fim

## 2015-07-16 NOTE — Telephone Encounter (Signed)
LMTC./fim 

## 2015-07-16 NOTE — Telephone Encounter (Signed)
Josh with [facility] is calling with a heads-up and states he has been contacted by a friend to place [facility]. Please call.

## 2015-08-02 IMAGING — CR DG SHOULDER 2+V*L*
1 series · 1 of 1 positions shown · non-contrast
Comparison: Left shoulder CT 07/14/2014

CLINICAL DATA: Total shoulder arthroplasty, postoperative exam

EXAM:
LEFT SHOULDER - 2+ VIEW

[AP]
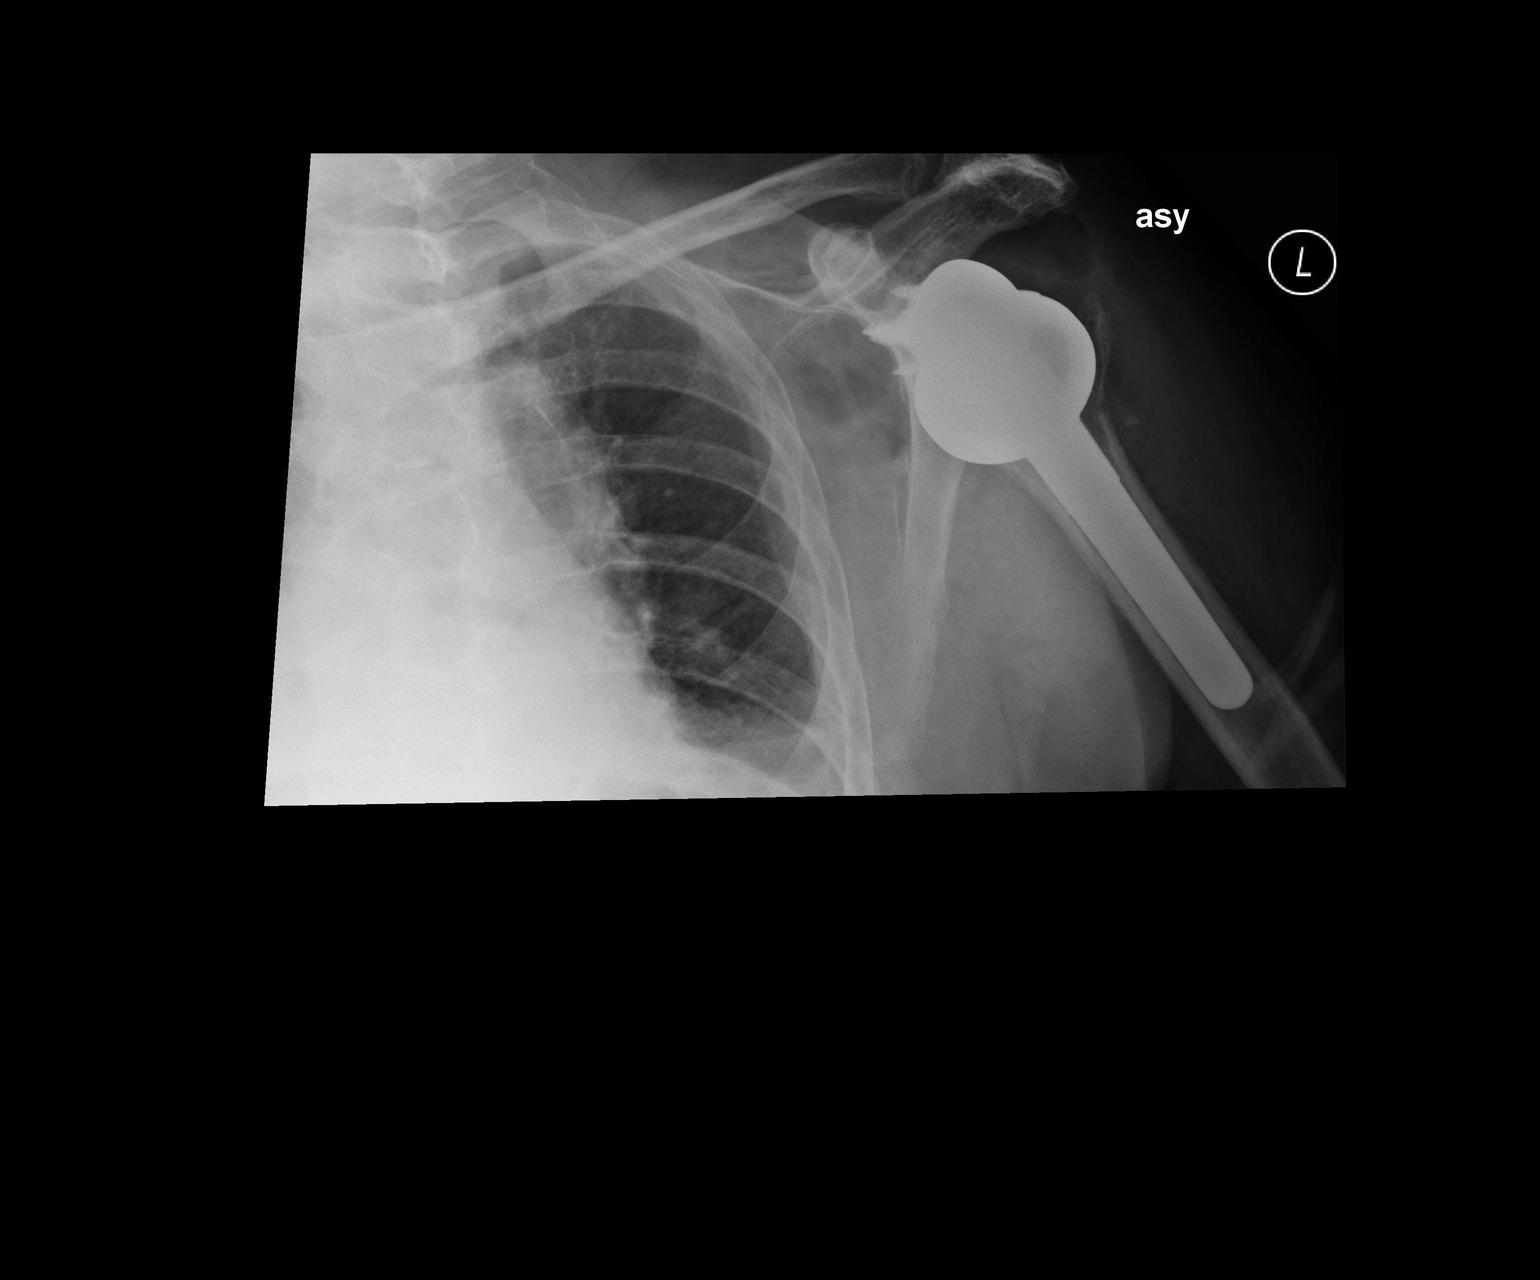

[1 of 1 positions shown; findings below may reference images not displayed]

FINDINGS: A single projection demonstrates left shoulder arthroplasty. No
evidence for hardware failure. Soft tissue gas is noted. A focal
radiopacity projecting over the soft tissues adjacent to the humeral
neck is identified, of unclear clinical significance. No fracture
line is seen. Small left pleural effusion is noted.
IMPRESSION: Left total shoulder arthroplasty. Focal radiopacity projecting over
the soft tissues of the level of the left humeral neck, of unclear
clinical significance.

## 2015-08-31 ENCOUNTER — Telehealth: Payer: Self-pay | Admitting: Neurology

## 2015-08-31 NOTE — Telephone Encounter (Signed)
Carmen/[facility] [phone] called to obtain official diagnosis for patient, is aware that patient was initially diagnosed with Parkinson's, needs to know if diagnosis is still the same or is there a new diagnosis?, she is getting ready to do referrals for patient and needs to know. Please call.

## 2015-08-31 NOTE — Telephone Encounter (Signed)
I have spoken with Porfirio MylarCarmen and advised that pt. has dx. of PD.  She verbalized understanding of same/fim

## 2015-09-30 DEATH — deceased
